# Patient Record
Sex: Male | Born: 1990 | Race: White | Hispanic: No | Marital: Married | Smoking: Never smoker
Health system: Southern US, Community
[De-identification: ages and names within clinical notes are randomized; demographics above are authoritative.]

## PROBLEM LIST (undated history)

## (undated) DIAGNOSIS — G4730 Sleep apnea, unspecified: Secondary | ICD-10-CM

## (undated) DIAGNOSIS — Z91018 Allergy to other foods: Secondary | ICD-10-CM

## (undated) DIAGNOSIS — J40 Bronchitis, not specified as acute or chronic: Secondary | ICD-10-CM

## (undated) DIAGNOSIS — J45909 Unspecified asthma, uncomplicated: Secondary | ICD-10-CM

## (undated) HISTORY — DX: Sleep apnea, unspecified: G47.30

## (undated) HISTORY — DX: Allergy to other foods: Z91.018

## (undated) HISTORY — PX: VASECTOMY: SHX75

## (undated) HISTORY — PX: LEG SURGERY: SHX1003

## (undated) HISTORY — PX: ANKLE FRACTURE SURGERY: SHX122

---

## 2013-11-08 DIAGNOSIS — R053 Chronic cough: Secondary | ICD-10-CM | POA: Insufficient documentation

## 2013-11-08 DIAGNOSIS — J302 Other seasonal allergic rhinitis: Secondary | ICD-10-CM

## 2013-11-08 DIAGNOSIS — F410 Panic disorder [episodic paroxysmal anxiety] without agoraphobia: Secondary | ICD-10-CM | POA: Insufficient documentation

## 2013-11-08 HISTORY — DX: Panic disorder (episodic paroxysmal anxiety): F41.0

## 2013-11-08 HISTORY — DX: Other seasonal allergic rhinitis: J30.2

## 2013-11-08 HISTORY — DX: Chronic cough: R05.3

## 2014-04-16 ENCOUNTER — Encounter (HOSPITAL_COMMUNITY): Payer: Self-pay

## 2014-04-16 ENCOUNTER — Emergency Department (HOSPITAL_COMMUNITY): Payer: 59

## 2014-04-16 ENCOUNTER — Emergency Department (HOSPITAL_COMMUNITY)
Admission: EM | Admit: 2014-04-16 | Discharge: 2014-04-16 | Disposition: A | Discharge status: Home / Self Care | Payer: 59 | Attending: Emergency Medicine | Admitting: Emergency Medicine

## 2014-04-16 DIAGNOSIS — R059 Cough, unspecified: Secondary | ICD-10-CM

## 2014-04-16 DIAGNOSIS — R05 Cough: Secondary | ICD-10-CM

## 2014-04-16 DIAGNOSIS — J45909 Unspecified asthma, uncomplicated: Secondary | ICD-10-CM | POA: Diagnosis not present

## 2014-04-16 DIAGNOSIS — J069 Acute upper respiratory infection, unspecified: Secondary | ICD-10-CM | POA: Diagnosis not present

## 2014-04-16 HISTORY — DX: Unspecified asthma, uncomplicated: J45.909

## 2014-04-16 HISTORY — DX: Bronchitis, not specified as acute or chronic: J40

## 2014-04-16 MED ORDER — ALBUTEROL SULFATE HFA 108 (90 BASE) MCG/ACT IN AERS
2.0000 | INHALATION_SPRAY | RESPIRATORY_TRACT | Status: DC | PRN
  Administered 2014-04-16: 2 via RESPIRATORY_TRACT
  Filled 2014-04-16: qty 6.7

## 2014-04-16 MED ORDER — IPRATROPIUM-ALBUTEROL 0.5-2.5 (3) MG/3ML IN SOLN
3.0000 mL | Freq: Once | RESPIRATORY_TRACT | Status: AC
  Administered 2014-04-16: 3 mL via RESPIRATORY_TRACT
  Filled 2014-04-16: qty 3

## 2014-04-16 MED ORDER — GUAIFENESIN-CODEINE 100-10 MG/5ML PO SOLN: 5.0000 mL | Freq: Three times a day (TID) | ORAL | Status: DC | PRN

## 2014-04-16 MED ORDER — BENZONATATE 100 MG PO CAPS: 100.0000 mg | ORAL_CAPSULE | Freq: Three times a day (TID) | ORAL | Status: DC

## 2014-04-16 NOTE — ED Provider Notes (Signed)
CSN: [id number]     Arrival date & time 04/16/14  0807 History   First MD Initiated Contact with Patient 04/16/14 0818     Chief Complaint  Patient presents with  . Cough     (Consider location/radiation/quality/duration/timing/severity/associated sxs/prior Treatment) HPI  Pt is a 24y/o who presents to the ED today c/o cough x2d. Pt sts he woke up Thursday morning with cough, and had a mild sore throat the day before that has resolved. The cough occasionally produces a thin green sputum, but is not productive most of the time. He also c/o central CP that was initially intermittent and associated with cough. The pain is worsened by deep inspiration, cough, and palpation. It is non-radiating and he rates it 7/10. He does endorse some post-tussive SOB and DOE. He has tried no home therapies or anti-tussives. He denies fever/chills, ear pain, trouble swallowing, stiff neck, hemoptysis, NVD, pain in legs, or leg swelling.   Past Medical History  Diagnosis Date  . Bronchitis   . Asthma    Past Surgical History  Procedure Laterality Date  . Leg surgery    . Ankle fracture surgery     History reviewed. No pertinent family history. History  Substance Use Topics  . Smoking status: Never Smoker   . Smokeless tobacco: Not on file  . Alcohol Use: Yes    Review of Systems  10 Systems reviewed and are negative for acute change except as noted in the HPI.     Allergies  Review of patient's allergies indicates not on file.  Home Medications   Prior to Admission medications   Not on File   BP 142/91 mmHg  Pulse 73  Temp(Src) 98.8 F (37.1 C) (Oral)  Resp 16  SpO2 99% Physical Exam  Constitutional: He appears well-developed and well-nourished. No distress.  HENT:  Head: Normocephalic and atraumatic.  Right Ear: Tympanic membrane and ear canal normal.  Left Ear: Tympanic membrane and ear canal normal.  Nose: Rhinorrhea present. Right sinus exhibits no maxillary sinus tenderness  and no frontal sinus tenderness. Left sinus exhibits no maxillary sinus tenderness and no frontal sinus tenderness.  Mouth/Throat: Uvula is midline, oropharynx is clear and moist and mucous membranes are normal.  Eyes: Pupils are equal, round, and reactive to light.  Neck: Normal range of motion. Neck supple.  Cardiovascular: Normal rate and regular rhythm.   Pulmonary/Chest: Effort normal.  Mildly decreased airway movement, no wheezing, rales or rhonchi. Pt coughing during exam.  Abdominal: Soft. Bowel sounds are normal. There is no tenderness. There is no rebound and no guarding.  Neurological: He is alert.  Skin: Skin is warm and dry.  Nursing note and vitals reviewed.   ED Course  Procedures (including critical care time) Labs Review Labs Reviewed - No data to display  Imaging Review No results found.   EKG Interpretation None      MDM   Final diagnoses:  Cough    Pt is well appearing with URI symptoms, mild sore throat that resolved and transitioned into cough for 2 days. He has not had headache, fevers, rash, SOB at rest, fatigue, chest pains or concerning symptoms.   Medications  albuterol (PROVENTIL HFA;VENTOLIN HFA) 108 (90 BASE) MCG/ACT inhaler 2 puff (2 puffs Inhalation Given 04/16/14 0941)  ipratropium-albuterol (DUONEB) 0.5-2.5 (3) MG/3ML nebulizer solution 3 mL (3 mLs Nebulization Given 04/16/14 0902)    Pt reports mild improvement with nebulizer, still coughing. Requests work note. He has been advised to return if  he develops fevers, neck pain, weakness, or worsening of any of his symptoms. Neg chest xray in ED today.  benzonatate (TESSALON) 100 MG capsule Take 1 capsule (100 mg total) by mouth every 8 (eight) hours. 21 capsule Dorthula Matas, PA-C    guaiFENesin-codeine 100-10 MG/5ML syrup Take 5 mLs by mouth 3 (three) times daily as needed for cough. 120 mL Dorthula Matas, PA-C     24 y.o.[name]'s evaluation in the Emergency Department is  complete. It has been determined that no acute conditions requiring further emergency intervention are present at this time. The patient/guardian have been advised of the diagnosis and plan. We have discussed signs and symptoms that warrant return to the ED, such as changes or worsening in symptoms.  Vital signs are stable at discharge. Filed Vitals:   04/16/14 0816  BP: 142/91  Pulse: 73  Temp: 98.8 F (37.1 C)  Resp: 16    Patient/guardian has voiced understanding and agreed to follow-up with the PCP or specialist.     Dorthula Matas, PA-C 04/16/14 0945  Derwood Kaplan, MD 04/16/14 1204

## 2014-04-16 NOTE — ED Notes (Signed)
Pt c/o chest congestion and cough x 2 days.  Pain score 7/10.  Pt has not taken anything for symptoms.  Dry cough noted.

## 2014-04-16 NOTE — Discharge Instructions (Signed)
Cough, Adult ° A cough is a reflex that helps clear your throat and airways. It can help heal the body or may be a reaction to an irritated airway. A cough may only last 2 or 3 weeks (acute) or may last more than 8 weeks (chronic).  °CAUSES °Acute cough: °· Viral or bacterial infections. °Chronic cough: °· Infections. °· Allergies. °· Asthma. °· Post-nasal drip. °· Smoking. °· Heartburn or acid reflux. °· Some medicines. °· Chronic lung problems (COPD). °· Cancer. °SYMPTOMS  °· Cough. °· Fever. °· Chest pain. °· Increased breathing rate. °· High-pitched whistling sound when breathing (wheezing). °· Colored mucus that you cough up (sputum). °TREATMENT  °· A bacterial cough may be treated with antibiotic medicine. °· A viral cough must run its course and will not respond to antibiotics. °· Your caregiver may recommend other treatments if you have a chronic cough. °HOME CARE INSTRUCTIONS  °· Only take over-the-counter or prescription medicines for pain, discomfort, or fever as directed by your caregiver. Use cough suppressants only as directed by your caregiver. °· Use a cold steam vaporizer or humidifier in your bedroom or home to help loosen secretions. °· Sleep in a semi-upright position if your cough is worse at night. °· Rest as needed. °· Stop smoking if you smoke. °SEEK IMMEDIATE MEDICAL CARE IF:  °· You have pus in your sputum. °· Your cough starts to worsen. °· You cannot control your cough with suppressants and are losing sleep. °· You begin coughing up blood. °· You have difficulty breathing. °· You develop pain which is getting worse or is uncontrolled with medicine. °· You have a fever. °MAKE SURE YOU:  °· Understand these instructions. °· Will watch your condition. °· Will get help right away if you are not doing well or get worse. °Document Released: 08/24/2010 Document Revised: 05/20/2011 Document Reviewed: 08/24/2010 °ExitCare® Patient Information ©2015 [employer]. This information is not intended  to replace advice given to you by your health care provider. Make sure you discuss any questions you have with your health care provider. °Upper Respiratory Infection, Adult °An upper respiratory infection (URI) is also sometimes known as the common cold. The upper respiratory tract includes the nose, sinuses, throat, trachea, and bronchi. Bronchi are the airways leading to the lungs. Most people improve within 1 week, but symptoms can last up to 2 weeks. A residual cough may last even longer.  °CAUSES °Many different viruses can infect the tissues lining the upper respiratory tract. The tissues become irritated and inflamed and often become very moist. Mucus production is also common. A cold is contagious. You can easily spread the virus to others by oral contact. This includes kissing, sharing a glass, coughing, or sneezing. Touching your mouth or nose and then touching a surface, which is then touched by another person, can also spread the virus. °SYMPTOMS  °Symptoms typically develop 1 to 3 days after you come in contact with a cold virus. Symptoms vary from person to person. They may include: °· Runny nose. °· Sneezing. °· Nasal congestion. °· Sinus irritation. °· Sore throat. °· Loss of voice (laryngitis). °· Cough. °· Fatigue. °· Muscle aches. °· Loss of appetite. °· Headache. °· Low-grade fever. °DIAGNOSIS  °You might diagnose your own cold based on familiar symptoms, since most people get a cold 2 to 3 times a year. Your caregiver can confirm this based on your exam. Most importantly, your caregiver can check that your symptoms are not due to another disease such   as strep throat, sinusitis, pneumonia, asthma, or epiglottitis. Blood tests, throat tests, and X-rays are not necessary to diagnose a common cold, but they may sometimes be helpful in excluding other more serious diseases. Your caregiver will decide if any further tests are required. °RISKS AND COMPLICATIONS  °You may be at risk for a more severe  case of the common cold if you smoke cigarettes, have chronic heart disease (such as heart failure) or lung disease (such as asthma), or if you have a weakened immune system. The very young and very old are also at risk for more serious infections. Bacterial sinusitis, middle ear infections, and bacterial pneumonia can complicate the common cold. The common cold can worsen asthma and chronic obstructive pulmonary disease (COPD). Sometimes, these complications can require emergency medical care and may be life-threatening. °PREVENTION  °The best way to protect against getting a cold is to practice good hygiene. Avoid oral or hand contact with people with cold symptoms. Wash your hands often if contact occurs. There is no clear evidence that vitamin C, vitamin E, echinacea, or exercise reduces the chance of developing a cold. However, it is always recommended to get plenty of rest and practice good nutrition. °TREATMENT  °Treatment is directed at relieving symptoms. There is no cure. Antibiotics are not effective, because the infection is caused by a virus, not by bacteria. Treatment may include: °· Increased fluid intake. Sports drinks offer valuable electrolytes, sugars, and fluids. °· Breathing heated mist or steam (vaporizer or shower). °· Eating chicken soup or other clear broths, and maintaining good nutrition. °· Getting plenty of rest. °· Using gargles or lozenges for comfort. °· Controlling fevers with ibuprofen or acetaminophen as directed by your caregiver. °· Increasing usage of your inhaler if you have asthma. °Zinc gel and zinc lozenges, taken in the first 24 hours of the common cold, can shorten the duration and lessen the severity of symptoms. Pain medicines may help with fever, muscle aches, and throat pain. A variety of non-prescription medicines are available to treat congestion and runny nose. Your caregiver can make recommendations and may suggest nasal or lung inhalers for other symptoms.  °HOME  CARE INSTRUCTIONS  °· Only take over-the-counter or prescription medicines for pain, discomfort, or fever as directed by your caregiver. °· Use a warm mist humidifier or inhale steam from a shower to increase air moisture. This may keep secretions moist and make it easier to breathe. °· Drink enough water and fluids to keep your urine clear or pale yellow. °· Rest as needed. °· Return to work when your temperature has returned to normal or as your caregiver advises. You may need to stay home longer to avoid infecting others. You can also use a face mask and careful hand washing to prevent spread of the virus. °SEEK MEDICAL CARE IF:  °· After the first few days, you feel you are getting worse rather than better. °· You need your caregiver's advice about medicines to control symptoms. °· You develop chills, worsening shortness of breath, or brown or red sputum. These may be signs of pneumonia. °· You develop yellow or brown nasal discharge or pain in the face, especially when you bend forward. These may be signs of sinusitis. °· You develop a fever, swollen neck glands, pain with swallowing, or white areas in the back of your throat. These may be signs of strep throat. °SEEK IMMEDIATE MEDICAL CARE IF:  °· You have a fever. °· You develop severe or persistent headache, ear   pain, sinus pain, or chest pain. °· You develop wheezing, a prolonged cough, cough up blood, or have a change in your usual mucus (if you have chronic lung disease). °· You develop sore muscles or a stiff neck. °Document Released: 08/21/2000 Document Revised: 05/20/2011 Document Reviewed: 06/02/2013 °ExitCare® Patient Information ©2015 [employer]. This information is not intended to replace advice given to you by your health care provider. Make sure you discuss any questions you have with your health care provider. ° °

## 2014-06-10 ENCOUNTER — Emergency Department (HOSPITAL_COMMUNITY)
Admission: EM | Admit: 2014-06-10 | Discharge: 2014-06-10 | Disposition: A | Discharge status: Home / Self Care | Payer: 59 | Attending: Emergency Medicine | Admitting: Emergency Medicine

## 2014-06-10 ENCOUNTER — Encounter (HOSPITAL_COMMUNITY): Payer: Self-pay | Admitting: Emergency Medicine

## 2014-06-10 ENCOUNTER — Emergency Department (HOSPITAL_COMMUNITY): Payer: 59

## 2014-06-10 DIAGNOSIS — Y9239 Other specified sports and athletic area as the place of occurrence of the external cause: Secondary | ICD-10-CM | POA: Insufficient documentation

## 2014-06-10 DIAGNOSIS — J45909 Unspecified asthma, uncomplicated: Secondary | ICD-10-CM | POA: Insufficient documentation

## 2014-06-10 DIAGNOSIS — Y998 Other external cause status: Secondary | ICD-10-CM | POA: Diagnosis not present

## 2014-06-10 DIAGNOSIS — W1839XA Other fall on same level, initial encounter: Secondary | ICD-10-CM | POA: Insufficient documentation

## 2014-06-10 DIAGNOSIS — Z79899 Other long term (current) drug therapy: Secondary | ICD-10-CM | POA: Diagnosis not present

## 2014-06-10 DIAGNOSIS — S20212A Contusion of left front wall of thorax, initial encounter: Secondary | ICD-10-CM | POA: Diagnosis not present

## 2014-06-10 DIAGNOSIS — Y9301 Activity, walking, marching and hiking: Secondary | ICD-10-CM | POA: Diagnosis not present

## 2014-06-10 DIAGNOSIS — S299XXA Unspecified injury of thorax, initial encounter: Secondary | ICD-10-CM | POA: Diagnosis present

## 2014-06-10 NOTE — ED Notes (Signed)
Per pt, states he was hiking and fell on his left side-felt something bulge out-went to PCP and eval was negative-woke up this morning with pain under left rib cage

## 2014-06-10 NOTE — ED Provider Notes (Signed)
CSN: [id number]     Arrival date & time 06/10/14  1203 History  This chart was scribed for Elpidio AnisShari Yamaris Cummings, PA-C, working with Raeford RazorStephen Kohut, MD by Chestine SporeSoijett Blue, ED Scribe. The patient was seen in room WTR6/WTR6 at 12:26 PM.    Chief Complaint  Patient presents with  . rib cage pain      The history is provided by the patient. No language interpreter was used.    HPI Comments: Neil White is a 24 y.o. male who presents to the Emergency Department complaining of left sided rib cage pain onset 3 weeks ago. Pt [place] and he fell on his left side 3 weeks ago. Pt notes that when he fell he felt it bulge out and come back in. Pt has been seen by his PCP last week for this issue and was informed that everything was negative. Pt has not had a X-ray done yet. Pt was informed by his PCP that there were no masses and that he should wait a couple weeks. No pleuritic chest pain, SOB or cough. He denies vomiting, abdominal pain, nausea, and any other symptoms.   Past Medical History  Diagnosis Date  . Bronchitis   . Asthma    Past Surgical History  Procedure Laterality Date  . Leg surgery    . Ankle fracture surgery     No family history on file. History  Substance Use Topics  . Smoking status: Never Smoker   . Smokeless tobacco: Not on file  . Alcohol Use: Yes    Review of Systems  Constitutional: Negative for fever.  Respiratory: Negative for cough and shortness of breath.   Cardiovascular: Positive for chest pain.  Gastrointestinal: Negative for nausea, vomiting and abdominal pain.  Musculoskeletal: Positive for arthralgias.      Allergies  Other  Home Medications   Prior to Admission medications   Medication Sig Start Date End Date Taking? Authorizing Provider  Multiple Vitamin (MULTIVITAMIN WITH MINERALS) TABS tablet Take 1 tablet by mouth daily.   Yes Historical Provider, MD  NON FORMULARY Take 1 tablet by mouth 3 (three) times daily. Metabolism  Booster.   Yes Historical Provider, MD  NON FORMULARY Take 1 capsule by mouth 3 (three) times daily. Fat Burner.   Yes Historical Provider, MD  NON FORMULARY Take 1 capsule by mouth 3 (three) times daily. Nutrient Absorber.   Yes Historical Provider, MD  benzonatate (TESSALON) 100 MG capsule Take 1 capsule (100 mg total) by mouth every 8 (eight) hours. Patient not taking: Reported on 06/10/2014 04/16/14   Marlon Peliffany Greene, PA-C  guaiFENesin-codeine 100-10 MG/5ML syrup Take 5 mLs by mouth 3 (three) times daily as needed for cough. Patient not taking: Reported on 06/10/2014 04/16/14   Marlon Peliffany Greene, PA-C   BP 156/88 mmHg  Pulse 103  Temp(Src) 98.2 F (36.8 C) (Oral)  Resp 16  SpO2 100%  Physical Exam  Constitutional: He is oriented to person, place, and time. He appears well-developed and well-nourished. No distress.  HENT:  Head: Normocephalic and atraumatic.  Eyes: EOM are normal.  Neck: Neck supple. No tracheal deviation present.  Cardiovascular: Normal rate.   Pulmonary/Chest: Effort normal and breath sounds normal. No respiratory distress. He has no decreased breath sounds.  Chest wall and abdominal wall appear atraumatic. No bruising or swelling. No chest wall or obvious tenderness. Full breathe sounds.   Abdominal: There is no tenderness. There is no CVA tenderness.  No LUQ tenderness. No CVA tenderness.  Abdominal wall appears atraumatic.   Musculoskeletal: Normal range of motion.  Neurological: He is alert and oriented to person, place, and time.  Skin: Skin is warm and dry.  Psychiatric: He has a normal mood and affect. His behavior is normal.  Nursing note and vitals reviewed.   ED Course  Procedures (including critical care time) DIAGNOSTIC STUDIES: Oxygen Saturation is 100% on RA, normal by my interpretation.    COORDINATION OF CARE: 12:29 PM-Discussed treatment plan which includes X-ray of Ribs Unilateral with chest left with pt at bedside and pt agreed to plan.   Labs  Review Labs Reviewed - No data to display  Imaging Review Dg Ribs Unilateral W/chest Left  06/10/2014   CLINICAL DATA:  Pain following fall 3 weeks prior  EXAM: LEFT RIBS AND CHEST - 3+ VIEW  COMPARISON:  Chest radiograph April 16, 2014  FINDINGS: Frontal chest as well as oblique and cone-down lower rib images obtained. Lungs are clear. Heart size and pulmonary vascularity are normal. No adenopathy. There is no pneumothorax or effusion. No demonstrable rib fracture.  IMPRESSION: No demonstrable rib fracture.  Lungs clear.   Electronically Signed   By: Bretta Bang III M.D.   On: 06/10/2014 13:06     EKG Interpretation None      MDM   Final diagnoses:  None    1. Rib contusion  Abdomen completely nontender. No concern for intra-abdominal injury. Lungs clear. Normal chest x-ray without obvious rib fracture. Stable for discharge.   I personally performed the services described in this documentation, which was scribed in my presence. The recorded information has been reviewed and is accurate.     Elpidio Anis, PA-C 06/10/14 1327  Raeford Razor, MD 06/11/14 [phone]

## 2014-06-10 NOTE — Discharge Instructions (Signed)

## 2015-01-04 ENCOUNTER — Ambulatory Visit (INDEPENDENT_AMBULATORY_CARE_PROVIDER_SITE_OTHER): Payer: BLUE CROSS/BLUE SHIELD | Admitting: Family Medicine

## 2015-01-04 VITALS — BP 114/68 | HR 74 | Temp 98.5°F | Resp 16 | Ht 67.0 in | Wt 180.2 lb

## 2015-01-04 DIAGNOSIS — S39011A Strain of muscle, fascia and tendon of abdomen, initial encounter: Secondary | ICD-10-CM

## 2015-01-04 NOTE — Progress Notes (Signed)
  Subjective:     Neil PennaChristopher White is a 24 y.o. male who presents for evaluation of abdominal pain. Onset was 3 days ago. Symptoms have been unchanged. The pain is described as hot, pressure-like and sharp, and is 5/10 in intensity. Pain is located in the R sided axillary line without radiation.  Aggravating factors: activity.  Alleviating factors: none. Associated symptoms: none. The patient denies anorexia, diarrhea, dysuria, fever, hematochezia, hematuria, melena, myalgias, nausea, sweats and vomiting.  The patient's history has been marked as reviewed and updated as appropriate.  Review of Systems Pertinent items are noted in HPI.     Objective:    BP 114/68 mmHg  Pulse 74  Temp(Src) 98.5 F (36.9 C) (Oral)  Resp 16  Ht 5\' 7"  (1.702 m)  Wt 180 lb 3.2 oz (81.738 kg)  BMI 28.22 kg/m2  SpO2 98%  General Appearance:    Alert, cooperative, no distress, appears stated age  Head:    Normocephalic, without obvious abnormality, atraumatic  Eyes:    PERRL, conjunctiva/corneas clear, EOM's intact, fundi    benign, both eyes       Ears:    Normal TM's and external ear canals, both ears  Nose:   Nares normal, septum midline, mucosa normal, no drainage    or sinus tenderness  Throat:   Lips, mucosa, and tongue normal; teeth and gums normal  Neck:   Supple, symmetrical, trachea midline, no adenopathy;       thyroid:  No enlargement/tenderness/nodules; no carotid   bruit or JVD  Back:     Symmetric, no curvature, ROM normal, no CVA tenderness  Lungs:     Clear to auscultation bilaterally, respirations unlabored  Chest wall:    No tenderness or deformity  Heart:    Regular rate and rhythm, S1 and S2 normal, no murmur, rub   or gallop  Abdomen:     Soft, non-tender, bowel sounds active all four quadrants,    no masses, no organomegaly.  TTP with SB to R at the external oblique  Genitalia:    Normal male without lesion, discharge or tenderness  Rectal:    Normal tone, normal prostate, no  masses or tenderness;   guaiac negative stool  Extremities:   Extremities normal, atraumatic, no cyanosis or edema  Pulses:   2+ and symmetric all extremities  Skin:   Skin color, texture, turgor normal, no rashes or lesions  Lymph nodes:   Cervical, supraclavicular, and axillary nodes normal  Neurologic:   CNII-XII intact. 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    Plan:    MSK muscle pain.  Recommend ice/heat, compression, ibuprofen.  F/U PRN.  No red flags on exam.

## 2015-01-04 NOTE — Patient Instructions (Signed)

## 2015-04-03 ENCOUNTER — Emergency Department (HOSPITAL_COMMUNITY)
Admission: EM | Admit: 2015-04-03 | Discharge: 2015-04-03 | Disposition: A | Discharge status: Home / Self Care | Payer: Managed Care, Other (non HMO) | Attending: Emergency Medicine | Admitting: Emergency Medicine

## 2015-04-03 ENCOUNTER — Encounter (HOSPITAL_COMMUNITY): Payer: Self-pay | Admitting: Emergency Medicine

## 2015-04-03 DIAGNOSIS — M545 Low back pain, unspecified: Secondary | ICD-10-CM

## 2015-04-03 DIAGNOSIS — J45909 Unspecified asthma, uncomplicated: Secondary | ICD-10-CM | POA: Diagnosis not present

## 2015-04-03 LAB — URINALYSIS, ROUTINE W REFLEX MICROSCOPIC
Bilirubin Urine: NEGATIVE
Glucose, UA: NEGATIVE mg/dL
Hgb urine dipstick: NEGATIVE
Ketones, ur: NEGATIVE mg/dL
Leukocytes, UA: NEGATIVE
NITRITE: NEGATIVE
PH: 7.5 (ref 5.0–8.0)
Protein, ur: NEGATIVE mg/dL
SPECIFIC GRAVITY, URINE: 1.015 (ref 1.005–1.030)

## 2015-04-03 LAB — BASIC METABOLIC PANEL
ANION GAP: 11 (ref 5–15)
BUN: 18 mg/dL (ref 6–20)
CHLORIDE: 101 mmol/L (ref 101–111)
CO2: 30 mmol/L (ref 22–32)
Calcium: 10.1 mg/dL (ref 8.9–10.3)
Creatinine, Ser: 1.11 mg/dL (ref 0.61–1.24)
GFR calc Af Amer: >60 mL/min (ref >60)
GFR calc non Af Amer: >60 mL/min (ref >60)
Glucose, Bld: 118 mg/dL — ABNORMAL HIGH (ref 65–99)
POTASSIUM: 3.7 mmol/L (ref 3.5–5.1)
SODIUM: 142 mmol/L (ref 135–145)

## 2015-04-03 LAB — CBC
HCT: 48.1 % (ref 39.0–52.0)
HEMOGLOBIN: 16.6 g/dL (ref 13.0–17.0)
MCH: 30.6 pg (ref 26.0–34.0)
MCHC: 34.5 g/dL (ref 30.0–36.0)
MCV: 88.6 fL (ref 78.0–100.0)
Platelets: 228 10*3/uL (ref 150–400)
RBC: 5.43 MIL/uL (ref 4.22–5.81)
RDW: 12.7 % (ref 11.5–15.5)
WBC: 6 10*3/uL (ref 4.0–10.5)

## 2015-04-03 LAB — URINE MICROSCOPIC-ADD ON
RBC / HPF: NONE SEEN RBC/hpf (ref 0–5)
SQUAMOUS EPITHELIAL / LPF: NONE SEEN
WBC UA: NONE SEEN WBC/hpf (ref 0–5)

## 2015-04-03 MED ORDER — ACETAMINOPHEN 325 MG PO TABS
650.0000 mg | ORAL_TABLET | Freq: Once | ORAL | Status: AC
  Administered 2015-04-03: 650 mg via ORAL
  Filled 2015-04-03: qty 2

## 2015-04-03 NOTE — Discharge Instructions (Signed)
Use OTC pain relievers such as tylenol or motrin for your pain. Heating pads and back stretches are other conservative pain management options.   Back Pain, Adult Back pain is very common in adults.The cause of back pain is rarely dangerous and the pain often gets better over time.The cause of your back pain may not be known. Some common causes of back pain include: 1. Strain of the muscles or ligaments supporting the spine. 2. Wear and tear (degeneration) of the spinal disks. 3. Arthritis. 4. Direct injury to the back. For many people, back pain may return. Since back pain is rarely dangerous, most people can learn to manage this condition on their own. HOME CARE INSTRUCTIONS Watch your back pain for any changes. The following actions may help to lessen any discomfort you are feeling: 1. Remain active. It is stressful on your back to sit or stand in one place for long periods of time. Do not sit, drive, or stand in one place for more than 30 minutes at a time. Take short walks on even surfaces as soon as you are able.Try to increase the length of time you walk each day. 2. Exercise regularly as directed by your health care provider. Exercise helps your back heal faster. It also helps avoid future injury by keeping your muscles strong and flexible. 3. Do not stay in bed.Resting more than 1-2 days can delay your recovery. 4. Pay attention to your body when you bend and lift. The most comfortable positions are those that put less stress on your recovering back. Always use proper lifting techniques, including: 1. Bending your knees. 2. Keeping the load close to your body. 3. Avoiding twisting. 5. Find a comfortable position to sleep. Use a firm mattress and lie on your side with your knees slightly bent. If you lie on your back, put a pillow under your knees. 6. Avoid feeling anxious or stressed.Stress increases muscle tension and can worsen back pain.It is important to recognize when you are  anxious or stressed and learn ways to manage it, such as with exercise. 7. Take medicines only as directed by your health care provider. Over-the-counter medicines to reduce pain and inflammation are often the most helpful.Your health care provider may prescribe muscle relaxant drugs.These medicines help dull your pain so you can more quickly return to your normal activities and healthy exercise. 8. Apply ice to the injured area: 1. Put ice in a plastic bag. 2. Place a towel between your skin and the bag. 3. Leave the ice on for 20 minutes, 2-3 times a day for the first 2-3 days. After that, ice and heat may be alternated to reduce pain and spasms. 9. Maintain a healthy weight. Excess weight puts extra stress on your back and makes it difficult to maintain good posture. SEEK MEDICAL CARE IF: 1. You have pain that is not relieved with rest or medicine. 2. You have increasing pain going down into the legs or buttocks. 3. You have pain that does not improve in one week. 4. You have night pain. 5. You lose weight. 6. You have a fever or chills. SEEK IMMEDIATE MEDICAL CARE IF:  1. You develop new bowel or bladder control problems. 2. You have unusual weakness or numbness in your arms or legs. 3. You develop nausea or vomiting. 4. You develop abdominal pain. 5. You feel faint.   This information is not intended to replace advice given to you by your health care provider. Make sure you discuss any  questions you have with your health care provider.   Document Released: 02/25/2005 Document Revised: 03/18/2014 Document Reviewed: 06/29/2013 Elsevier Interactive Patient Education 2016 [employer]  Back Exercises The following exercises strengthen the muscles that help to support the back. They also help to keep the lower back flexible. Doing these exercises can help to prevent back pain or lessen existing pain. If you have back pain or discomfort, try doing these exercises 2-3 times each day or  as told by your health care provider. When the pain goes away, do them once each day, but increase the number of times that you repeat the steps for each exercise (do more repetitions). If you do not have back pain or discomfort, do these exercises once each day or as told by your health care provider. EXERCISES Single Knee to Chest Repeat these steps 3-5 times for each leg: 5. Lie on your back on a firm bed or the floor with your legs extended. 6. Bring one knee to your chest. Your other leg should stay extended and in contact with the floor. 7. Hold your knee in place by grabbing your knee or thigh. 8. Pull on your knee until you feel a gentle stretch in your lower back. 9. Hold the stretch for 10-30 seconds. 10. Slowly release and straighten your leg. Pelvic Tilt Repeat these steps 5-10 times: 10. Lie on your back on a firm bed or the floor with your legs extended. 11. Bend your knees so they are pointing toward the ceiling and your feet are flat on the floor. 12. Tighten your lower abdominal muscles to press your lower back against the floor. This motion will tilt your pelvis so your tailbone points up toward the ceiling instead of pointing to your feet or the floor. 13. With gentle tension and even breathing, hold this position for 5-10 seconds. Cat-Cow Repeat these steps until your lower back becomes more flexible: 7. Get into a hands-and-knees position on a firm surface. Keep your hands under your shoulders, and keep your knees under your hips. You may place padding under your knees for comfort. 8. Let your head hang down, and point your tailbone toward the floor so your lower back becomes rounded like the back of a cat. 9. Hold this position for 5 seconds. 10. Slowly lift your head and point your tailbone up toward the ceiling so your back forms a sagging arch like the back of a cow. 11. Hold this position for 5 seconds. Press-Ups Repeat these steps 5-10 times: 6. Lie on your abdomen  (face-down) on the floor. 7. Place your palms near your head, about shoulder-width apart. 8. While you keep your back as relaxed as possible and keep your hips on the floor, slowly straighten your arms to raise the top half of your body and lift your shoulders. Do not use your back muscles to raise your upper torso. You may adjust the placement of your hands to make yourself more comfortable. 9. Hold this position for 5 seconds while you keep your back relaxed. 10. Slowly return to lying flat on the floor. Bridges Repeat these steps 10 times: 1. Lie on your back on a firm surface. 2. Bend your knees so they are pointing toward the ceiling and your feet are flat on the floor. 3. Tighten your buttocks muscles and lift your buttocks off of the floor until your waist is at almost the same height as your knees. You should feel the muscles working in your buttocks and  the back of your thighs. If you do not feel these muscles, slide your feet 1-2 inches farther away from your buttocks. 4. Hold this position for 3-5 seconds. 5. Slowly lower your hips to the starting position, and allow your buttocks muscles to relax completely. If this exercise is too easy, try doing it with your arms crossed over your chest. Abdominal Crunches Repeat these steps 5-10 times: 1. Lie on your back on a firm bed or the floor with your legs extended. 2. Bend your knees so they are pointing toward the ceiling and your feet are flat on the floor. 3. Cross your arms over your chest. 4. Tip your chin slightly toward your chest without bending your neck. 5. Tighten your abdominal muscles and slowly raise your trunk (torso) high enough to lift your shoulder blades a tiny bit off of the floor. Avoid raising your torso higher than that, because it can put too much stress on your low back and it does not help to strengthen your abdominal muscles. 6. Slowly return to your starting position. Back Lifts Repeat these steps 5-10  times: 1. Lie on your abdomen (face-down) with your arms at your sides, and rest your forehead on the floor. 2. Tighten the muscles in your legs and your buttocks. 3. Slowly lift your chest off of the floor while you keep your hips pressed to the floor. Keep the back of your head in line with the curve in your back. Your eyes should be looking at the floor. 4. Hold this position for 3-5 seconds. 5. Slowly return to your starting position. SEEK MEDICAL CARE IF:  Your back pain or discomfort gets much worse when you do an exercise.  Your back pain or discomfort does not lessen within 2 hours after you exercise. If you have any of these problems, stop doing these exercises right away. Do not do them again unless your health care provider says that you can. SEEK IMMEDIATE MEDICAL CARE IF:  You develop sudden, severe back pain. If this happens, stop doing the exercises right away. Do not do them again unless your health care provider says that you can.   This information is not intended to replace advice given to you by your health care provider. Make sure you discuss any questions you have with your health care provider.   Document Released: 04/04/2004 Document Revised: 11/16/2014 Document Reviewed: 04/21/2014 Elsevier Interactive Patient Education [employer]

## 2015-04-03 NOTE — ED Notes (Signed)
Pt c/o bilateral flank pain since Friday morning. Pt sts that he engaged in "very heavy" drinking on Thursday night and woke up hurting. Pt denies any other symptoms. No dysuria, hematuria, N/V. Denies injury. A&Ox4 and ambulatory.

## 2015-04-03 NOTE — ED Notes (Signed)
Patient was alert, oriented and stable upon discharge. RN went over AVS and patient had no further questions.  

## 2015-04-03 NOTE — ED Provider Notes (Signed)
CSN: [id number]     Arrival date & time 04/03/15  1103 History   First MD Initiated Contact with Patient 04/03/15 1330     Chief Complaint  Patient presents with  . Flank Pain   HPI  Mr. Bielefeld is a 25 year old male with PMHx of asthma presenting with lower back pain. He reports onset of symptoms 4 days ago. He reports "heavy drinking" the evening before symptom onset. He states that he woke up with bilateral lower back pain the next morning. He describes it as a "mild discomfort". The pain does not radiate. Originally the pain was only present when he was lying on his back or someone was touching his back. He states that this morning he noticed the pain when he was walking down the hallway. He states this is what prompted him to come in emergency department although his pain is currently beginning to improve. He states "this is what I imagine kidney pain feels like ". He has not taken any over-the-counter pain relievers. He denies associated fevers, chills, abdominal pain, nausea, vomiting, lower extremity weakness, lower extremity numbness, loss of bowel or bladder control, painful bowel movements, dysuria, change in the frequency of urination, penile discharge or testicular pain. Denies exposure to STDs. He has no other complaints today.  Past Medical History  Diagnosis Date  . Bronchitis   . Asthma    Past Surgical History  Procedure Laterality Date  . Leg surgery    . Ankle fracture surgery     Family History  Problem Relation Age of Onset  . Hyperlipidemia Father   . Heart disease Father   . Diabetes Maternal Grandmother   . Cancer Maternal Grandfather   . Diabetes Paternal Grandfather   . Heart disease Paternal Grandfather   . Hyperlipidemia Paternal Grandfather    Social History  Substance Use Topics  . Smoking status: Never Smoker   . Smokeless tobacco: None  . Alcohol Use: Yes    Review of Systems  Constitutional: Negative for fever and chills.  Gastrointestinal:  Negative for nausea, vomiting, abdominal pain, diarrhea, constipation and blood in stool.  Genitourinary: Negative for dysuria, frequency, hematuria, discharge, difficulty urinating and testicular pain.  Musculoskeletal: Positive for back pain. Negative for gait problem.  Skin: Negative for rash.  Neurological: Negative for weakness and numbness.  All other systems reviewed and are negative.     Allergies  Other  Home Medications   Prior to Admission medications   Medication Sig Start Date End Date Taking? Authorizing Provider  calcium carbonate (TUMS - DOSED IN MG ELEMENTAL CALCIUM) 500 MG chewable tablet Chew 1-2 tablets by mouth 3 (three) times daily as needed for indigestion or heartburn.   Yes Historical Provider, MD  guaiFENesin (ROBITUSSIN) 100 MG/5ML liquid Take 400 mg by mouth 3 (three) times daily as needed for cough.   Yes Historical Provider, MD  guaiFENesin-codeine 100-10 MG/5ML syrup Take 5 mLs by mouth 3 (three) times daily as needed for cough. Patient not taking: Reported on 06/10/2014 04/16/14   Marlon Pel, PA-C   BP 135/78 mmHg  Pulse 63  Temp(Src) 98.5 F (36.9 C) (Oral)  Resp 16  SpO2 99% Physical Exam  Constitutional: He appears well-developed and well-nourished. No distress.  HENT:  Head: Normocephalic and atraumatic.  Eyes: Conjunctivae are normal. Right eye exhibits no discharge. Left eye exhibits no discharge. No scleral icterus.  Neck: Normal range of motion.  Cardiovascular: Normal rate and regular rhythm.   Pulmonary/Chest: Effort normal.  No respiratory distress.  Abdominal: Soft. There is no tenderness.  Genitourinary: Rectum normal and prostate normal.  Prostate is not enlarged, tender, boggy or warm. Rectal exam chaperoned by Molly Maduro, EMT  Musculoskeletal: Normal range of motion.       Lumbar back: He exhibits normal range of motion, no tenderness, no deformity and no spasm.       Back:  Pt indicates site of pain as being his bilateral lumbar  region. Lumbar region is not tender to palpation. No focal tenderness over lumbar spine. FROM of lumbar and thoracic spine without pain. Pt walks with a steady gait and moves all extremities spontaneously.   Neurological: He is alert. Coordination normal.  5/5 strength of bilateral lower extremities. Sensation to light touch intact.   Skin: Skin is warm and dry.  No erythema, rash or other skin changes noted over lumbar region  Psychiatric: He has a normal mood and affect. His behavior is normal.  Nursing note and vitals reviewed.   ED Course  Procedures (including critical care time) Labs Review Labs Reviewed  URINALYSIS, ROUTINE W REFLEX MICROSCOPIC (NOT AT [facility]) - Abnormal; Notable for the following:    APPearance TURBID (*)    All other components within normal limits  BASIC METABOLIC PANEL - Abnormal; Notable for the following:    Glucose, Bld 118 (*)    All other components within normal limits  URINE MICROSCOPIC-ADD ON - Abnormal; Notable for the following:    Bacteria, UA MANY (*)    All other components within normal limits  URINE CULTURE  CBC    Imaging Review No results found. I have personally reviewed and evaluated these images and lab results as part of my medical decision-making.   EKG Interpretation None      MDM   Final diagnoses:  Bilateral low back pain without sciatica   Patient presenting with lower back pain x 4 days. Afebrile. Non-focal neuro exam. No tenderness over lumbar region with FROM of back intact without pain. Patient is able to ambulate without pain. No signs of prostatitis. No loss of bowel or bladder control. No numbness or weakness in the lower extremities. No concern for cauda equina. No history of IVDU or cancer. UA positive for many bacteria without leuks or nitrites. No GU symptoms. Clinical picture not consistent with infection. Will send for culture and give pt urology referral as requested. Conservative therapy including back  exercises, heat, ice, tylenol or ibuprofen discussed.  Return precautions discussed and given in discharge paperwork. Pt is stable for discharge.      Rolm Gala Iori Gigante, PA-C 04/03/15 1522  Arby Barrette, MD 04/03/15 [phone]

## 2015-04-04 LAB — URINE CULTURE: Culture: NO GROWTH

## 2015-08-13 ENCOUNTER — Encounter (HOSPITAL_COMMUNITY): Payer: Self-pay

## 2015-08-13 ENCOUNTER — Emergency Department (HOSPITAL_COMMUNITY): Payer: 59

## 2015-08-13 ENCOUNTER — Emergency Department (HOSPITAL_COMMUNITY)
Admission: EM | Admit: 2015-08-13 | Discharge: 2015-08-13 | Disposition: A | Discharge status: Home / Self Care | Payer: 59 | Attending: Emergency Medicine | Admitting: Emergency Medicine

## 2015-08-13 DIAGNOSIS — R0789 Other chest pain: Secondary | ICD-10-CM

## 2015-08-13 DIAGNOSIS — J45909 Unspecified asthma, uncomplicated: Secondary | ICD-10-CM | POA: Insufficient documentation

## 2015-08-13 MED ORDER — NAPROXEN 500 MG PO TABS: 500.0000 mg | ORAL_TABLET | Freq: Two times a day (BID) | ORAL | Status: DC

## 2015-08-13 MED ORDER — METHOCARBAMOL 750 MG PO TABS: 750.0000 mg | ORAL_TABLET | Freq: Four times a day (QID) | ORAL | Status: DC

## 2015-08-13 NOTE — ED Notes (Signed)
Per MD, ok to hold on labs at this time.

## 2015-08-13 NOTE — Discharge Instructions (Signed)

## 2015-08-13 NOTE — ED Notes (Addendum)
Pt states chest pain non radiating x 2 weeks.  Cough chronic since childhood.  Pain increases with movement.  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  Pt also states he takes tums for reflux.

## 2015-08-13 NOTE — ED Provider Notes (Signed)
CSN: [id number]     Arrival date & time 08/13/15  1210 History   First MD Initiated Contact with Patient 08/13/15 1315     Chief Complaint  Patient presents with  . Chest Pain  . Shortness of Breath     (Consider location/radiation/quality/duration/timing/severity/associated sxs/prior Treatment) Patient is a 25 y.o. male presenting with chest pain and shortness of breath. The history is provided by the patient.  Chest Pain Pain location:  L chest Pain quality: sharp   Pain radiates to:  Does not radiate Pain radiates to the back: no   Pain severity:  Mild Onset quality:  Gradual Duration:  2 weeks Timing:  Constant Progression:  Unchanged Chronicity:  New Context: movement   Relieved by:  Nothing Worsened by:  Nothing tried Ineffective treatments:  None tried Associated symptoms: shortness of breath   Shortness of Breath Associated symptoms: chest pain     Past Medical History  Diagnosis Date  . Bronchitis   . Asthma    Past Surgical History  Procedure Laterality Date  . Leg surgery    . Ankle fracture surgery     Family History  Problem Relation Age of Onset  . Hyperlipidemia Father   . Heart disease Father   . Diabetes Maternal Grandmother   . Cancer Maternal Grandfather   . Diabetes Paternal Grandfather   . Heart disease Paternal Grandfather   . Hyperlipidemia Paternal Grandfather    Social History  Substance Use Topics  . Smoking status: Never Smoker   . Smokeless tobacco: None  . Alcohol Use: Yes    Review of Systems  Respiratory: Positive for shortness of breath.   Cardiovascular: Positive for chest pain.  All other systems reviewed and are negative.     Allergies  Other and Banana  Home Medications   Prior to Admission medications   Medication Sig Start Date End Date Taking? Authorizing Provider  calcium carbonate (TUMS - DOSED IN MG ELEMENTAL CALCIUM) 500 MG chewable tablet Chew 1-2 tablets by mouth 3 (three) times daily as needed for  indigestion or heartburn.   Yes Historical Provider, MD  guaiFENesin-codeine 100-10 MG/5ML syrup Take 5 mLs by mouth 3 (three) times daily as needed for cough. Patient not taking: Reported on 06/10/2014 04/16/14   Marlon Peliffany Greene, PA-C   BP 153/89 mmHg  Pulse 100  Temp(Src) 98.2 F (36.8 C) (Oral)  Resp 20  SpO2 100% Physical Exam  Constitutional: He is oriented to person, place, and time. He appears well-developed and well-nourished.  Non-toxic appearance. No distress.  HENT:  Head: Normocephalic and atraumatic.  Eyes: Conjunctivae, EOM and lids are normal. Pupils are equal, round, and reactive to light.  Neck: Normal range of motion. Neck supple. No tracheal deviation present. No thyroid mass present.  Cardiovascular: Normal rate, regular rhythm and normal heart sounds.  Exam reveals no gallop.   No murmur heard. Pulmonary/Chest: Effort normal and breath sounds normal. No stridor. No respiratory distress. He has no decreased breath sounds. He has no wheezes. He has no rhonchi. He has no rales.  Abdominal: Soft. Normal appearance and bowel sounds are normal. He exhibits no distension. There is no tenderness. There is no rebound and no CVA tenderness.  Musculoskeletal: Normal range of motion. He exhibits no edema or tenderness.  Neurological: He is alert and oriented to person, place, and time. He has normal strength. No cranial nerve deficit or sensory deficit. GCS eye subscore is 4. GCS verbal subscore is 5. GCS motor subscore is  6.  Skin: Skin is warm and dry. No abrasion and no rash noted.  Psychiatric: He has a normal mood and affect. His speech is normal and behavior is normal.  Nursing note and vitals reviewed.   ED Course  Procedures (including critical care time) Labs Review Labs Reviewed - No data to display  Imaging Review Dg Chest 2 View  08/13/2015  CLINICAL DATA:  Chest pain EXAM: CHEST  2 VIEW COMPARISON:  06/10/2014 FINDINGS: The heart size and mediastinal contours are  within normal limits. Both lungs are clear. The visualized skeletal structures are unremarkable. IMPRESSION: No active cardiopulmonary disease. Electronically Signed   By: Signa Kell M.D.   On: 08/13/2015 12:57   I have personally reviewed and evaluated these images and lab results as part of my medical decision-making.   EKG Interpretation   Date/Time:  Sunday August 13 2015 12:20:46 EDT Ventricular Rate:  76 PR Interval:  137 QRS Duration: 102 QT Interval:  365 QTC Calculation: 410 R Axis:   81 Text Interpretation:  Sinus rhythm Anterior infarct, old Nonspecific T  abnormalities, lateral leads Confirmed by Kateryn Marasigan  MD, Austynn Pridmore ([id number]) on  08/13/2015 1:20:51 PM      MDM   Final diagnoses:  None    Pt w/ chest wall pain , likely costochrondritis, will tx--no conern for acs for pr    Lorre Nick, MD 08/13/15 1327

## 2016-03-29 ENCOUNTER — Encounter: Payer: Self-pay | Admitting: Internal Medicine

## 2016-03-29 ENCOUNTER — Other Ambulatory Visit (INDEPENDENT_AMBULATORY_CARE_PROVIDER_SITE_OTHER): Payer: 59

## 2016-03-29 ENCOUNTER — Ambulatory Visit (INDEPENDENT_AMBULATORY_CARE_PROVIDER_SITE_OTHER): Payer: 59 | Admitting: Internal Medicine

## 2016-03-29 VITALS — BP 110/78 | HR 90 | Ht 67.0 in | Wt 193.2 lb

## 2016-03-29 DIAGNOSIS — J4530 Mild persistent asthma, uncomplicated: Secondary | ICD-10-CM

## 2016-03-29 DIAGNOSIS — J302 Other seasonal allergic rhinitis: Secondary | ICD-10-CM

## 2016-03-29 HISTORY — DX: Mild persistent asthma, uncomplicated: J45.30

## 2016-03-29 LAB — CBC WITH DIFFERENTIAL/PLATELET
Basophils Absolute: 0 10*3/uL (ref 0.0–0.1)
Basophils Relative: 0.4 % (ref 0.0–3.0)
EOS ABS: 0.5 10*3/uL (ref 0.0–0.7)
Eosinophils Relative: 5.5 % — ABNORMAL HIGH (ref 0.0–5.0)
HCT: 47.8 % (ref 39.0–52.0)
HEMOGLOBIN: 16.9 g/dL (ref 13.0–17.0)
Lymphocytes Relative: 29.7 % (ref 12.0–46.0)
Lymphs Abs: 2.6 10*3/uL (ref 0.7–4.0)
MCHC: 35.4 g/dL (ref 30.0–36.0)
MCV: 86.9 fl (ref 78.0–100.0)
MONO ABS: 0.7 10*3/uL (ref 0.1–1.0)
Monocytes Relative: 7.6 % (ref 3.0–12.0)
Neutro Abs: 5 10*3/uL (ref 1.4–7.7)
Neutrophils Relative %: 56.8 % (ref 43.0–77.0)
Platelets: 296 10*3/uL (ref 150.0–400.0)
RBC: 5.51 Mil/uL (ref 4.22–5.81)
RDW: 12.9 % (ref 11.5–15.5)
WBC: 8.8 10*3/uL (ref 4.0–10.5)

## 2016-03-29 LAB — NITRIC OXIDE: Other: 33

## 2016-03-29 MED ORDER — MONTELUKAST SODIUM 10 MG PO TABS: 10.0000 mg | ORAL_TABLET | Freq: Every day | ORAL | 11 refills | Status: DC

## 2016-03-29 NOTE — Patient Instructions (Addendum)
Plan A = Automatic = Singulair 10 mg one daily (montelukast is the generic name)   For drainage / throat tickle try take CHLORPHENIRAMINE  4 mg - take one every 4 hours as needed - available over the counter- may cause drowsiness so start with just a bedtime dose or two and see how you tolerate it before trying in daytime    Plan B = Backup Only use your albuterol(proair)  as a rescue medication to be used if you can't catch your breath by resting or doing a relaxed purse lip breathing pattern.  - The less you use it, the better it will work when you need it. - Ok to use the inhaler up to 2 puffs  every 4 hours if you must but call for appointment if use goes up over your usual need - Don't leave home without it !!  (think of it like the spare tire for your car)   Work on inhaler technique:  relax and gently blow all the way out then take a nice smooth deep breath back in, triggering the inhaler at same time you start breathing in.  Hold for up to 5 seconds if you can. Blow out thru nose. Rinse and gargle with water when done      Please remember to go to the lab  department downstairs for your tests - we will call you with the results when they are available.  Please schedule a follow up office visit in 6 weeks, call sooner if needed   A copy of this note will be sent to your primary care provider  Needs spirometry on return

## 2016-03-29 NOTE — Progress Notes (Signed)
Subjective:    Patient ID: Neil White, male    DOB: December 18, 1990,    MRN: [id number]  HPI  [age] yowm never smoker lots of passive exp until the age 26 with onset late teens of lungs burning coughing and wheezing esp in winter and spring time lots of nasal drainage eval by allergist in  no rx rec per pt and just treated himself with clariton until early Dec 2017 placed on proair which helped some and referred to pulmonary clinic 03/29/2016 by Dr   Via   03/29/2016 1st [facility] office visit/ Olivier Frayre   Chief Complaint  Patient presents with  . Pulm Consult    Referred by Dr. Susa SimmondsVia for increased SOB during cold months. Non productive cough for 10 years.    not needing any saba for weeks for sob but bothered by daytime cough x 10 years with sense of pnds worse in spring  Never tried singulair / rx flonase in past but never tried it   No obvious patterns in day to day or daytime variability or assoc excess/ purulent sputum or mucus plugs or hemoptysis or cp or chest tightness, subjective wheeze or overt   hb symptoms. No unusual exp hx or h/o childhood pna/ asthma or knowledge of premature birth.  Sleeping ok without nocturnal  or early am exacerbation  of respiratory  c/o's or need for noct saba. Also denies any obvious fluctuation of symptoms with weather or environmental changes or other aggravating or alleviating factors except as outlined above   Current Medications, Allergies, Complete Past Medical History, Past Surgical History, Family History, and Social History were reviewed in [facility] electronic medical record.   .         Review of Systems  Constitutional: Negative for fever and unexpected weight change.  HENT: Negative for congestion, dental problem, ear pain, nosebleeds, postnasal drip, rhinorrhea, sinus pressure, sneezing, sore throat and trouble swallowing.   Eyes: Negative for redness and itching.  Respiratory: Positive for shortness of breath. Negative  for cough, chest tightness and wheezing.   Cardiovascular: Negative for palpitations and leg swelling.  Gastrointestinal: Negative for nausea and vomiting.  Genitourinary: Negative for dysuria.  Musculoskeletal: Negative for joint swelling.  Skin: Negative for rash.  Neurological: Negative for headaches.  Hematological: Does not bruise/bleed easily.  Psychiatric/Behavioral: Negative for dysphoric mood. The patient is not nervous/anxious.        Objective:   Physical Exam    amb pleasant wm nad  Wt Readings from Last 3 Encounters:  03/29/16 193 lb 3.2 oz (87.6 kg)  01/04/15 180 lb 3.2 oz (81.7 kg)    Vital signs reviewed - - Note on arrival 02 sats  97% on RA     HEENT: nl dentition,  and oropharynx. Nl external ear canals without cough reflex - moderate bilateral non-specific turbinate edema     NECK :  without JVD/Nodes/TM/ nl carotid upstrokes bilaterally   LUNGS: no acc muscle use,  Nl contour chest which is clear to A and P bilaterally without cough on insp or exp maneuvers   CV:  RRR  no s3 or murmur or increase in P2, nad no edema   ABD:  soft and nontender with nl inspiratory excursion in the supine position. No bruits or organomegaly appreciated, bowel sounds nl  MS:  Nl gait/ ext warm without deformities, calf tenderness, cyanosis or clubbing No obvious joint restrictions   SKIN: warm and dry without lesions    NEURO:  alert, approp, nl sensorium with  no motor or cerebellar deficits apparent.      I personally reviewed images and agree with radiology impression as follows:  CXR:   08/13/15 No active cardiopulmonary disease.   Labs ordered 03/29/2016  Allergy profile        Assessment & Plan:

## 2016-03-30 DIAGNOSIS — J309 Allergic rhinitis, unspecified: Secondary | ICD-10-CM | POA: Insufficient documentation

## 2016-03-30 HISTORY — DX: Allergic rhinitis, unspecified: J30.9

## 2016-03-30 NOTE — Assessment & Plan Note (Addendum)
Allergy profile 03/29/2016 >  Eos 0.5/  IgE    Has not tried flonase yet but prefers pill so reasonable to first try singulair and prn h1's if tolerates   see avs for instructions unique to this ov

## 2016-03-30 NOTE — Assessment & Plan Note (Signed)
FENO 03/29/2016  =   34 - 03/29/2016  After extensive coaching HFA effectiveness =    75% from a baseline of 25% - trial of singulair 03/29/2016 >>>  Not really sure he has true chronic asthma at this point and may have more of an intermittent or cough variant pattern but since assoc with chronic rhinitis with seasonal flare reasonable to start rx with singulair and bring back with spirometry on return and consider maint rx then depending on whether there is residual airflow obstruction detected or not  - ICS if not obst/ ics/laba if there is   Total time devoted to counseling  > 50 % of 60 min initial office visit:  review case with pt/ discussion of options/alternatives/ personally creating written customized instructions  in presence of pt  then going over those specific  Instructions directly with the pt including how to use all of the meds but in particular covering each new medication in detail and the difference between the maintenance/automatic meds and the prns using an action plan format for the latter.  Please see AVS from this visit for a full list of these instructions which I personally wrote for this pt and  are unique to this visit.

## 2016-04-01 LAB — RESPIRATORY ALLERGY PROFILE REGION II ~~LOC~~
ALLERGEN, OAK, T7: 0.18 kU/L — AB
Allergen, C. Herbarum, M2: <0.1 kU/L
Allergen, Cedar tree, t12: 0.17 kU/L — ABNORMAL HIGH
Allergen, Comm Silver Birch, t9: 0.19 kU/L — ABNORMAL HIGH
Allergen, Cottonwood, t14: 0.2 kU/L — ABNORMAL HIGH
Allergen, D pternoyssinus,d7: 3.46 kU/L — ABNORMAL HIGH
Allergen, Mouse Urine Protein, e78: 0.3 kU/L — ABNORMAL HIGH
BOX ELDER: 1.27 kU/L — AB
Bermuda Grass: 0.2 kU/L — ABNORMAL HIGH
CAT DANDER: 46.2 kU/L — AB
COCKROACH: 0.14 kU/L — AB
COMMON RAGWEED: 0.21 kU/L — AB
D. farinae: 4.14 kU/L — ABNORMAL HIGH
Dog Dander: 38.3 kU/L — ABNORMAL HIGH
Elm IgE: 0.36 kU/L — ABNORMAL HIGH
IgE (Immunoglobulin E), Serum: 529 kU/L — ABNORMAL HIGH (ref <115)
Johnson Grass: 0.18 kU/L — ABNORMAL HIGH
PECAN/HICKORY TREE IGE: 3.63 kU/L — AB
Rough Pigweed  IgE: 0.37 kU/L — ABNORMAL HIGH
Sheep Sorrel IgE: 0.13 kU/L — ABNORMAL HIGH
TIMOTHY GRASS: 0.39 kU/L — AB

## 2016-04-01 NOTE — Progress Notes (Signed)
Spoke with pt and notified of results per Dr. Wert. Pt verbalized understanding and denied any questions. 

## 2018-10-14 ENCOUNTER — Other Ambulatory Visit: Payer: Self-pay

## 2018-10-14 ENCOUNTER — Encounter (HOSPITAL_COMMUNITY): Payer: Self-pay | Admitting: Obstetrics and Gynecology

## 2018-10-14 ENCOUNTER — Emergency Department (HOSPITAL_COMMUNITY)
Admission: EM | Admit: 2018-10-14 | Discharge: 2018-10-14 | Disposition: A | Discharge status: Home / Self Care | Payer: No Typology Code available for payment source | Attending: Emergency Medicine | Admitting: Emergency Medicine

## 2018-10-14 ENCOUNTER — Emergency Department (HOSPITAL_COMMUNITY): Payer: No Typology Code available for payment source

## 2018-10-14 DIAGNOSIS — K529 Noninfective gastroenteritis and colitis, unspecified: Secondary | ICD-10-CM

## 2018-10-14 DIAGNOSIS — J45909 Unspecified asthma, uncomplicated: Secondary | ICD-10-CM | POA: Insufficient documentation

## 2018-10-14 DIAGNOSIS — R1033 Periumbilical pain: Secondary | ICD-10-CM | POA: Diagnosis present

## 2018-10-14 LAB — COMPREHENSIVE METABOLIC PANEL
ALT: 40 U/L (ref 0–44)
AST: 28 U/L (ref 15–41)
Albumin: 4.6 g/dL (ref 3.5–5.0)
Alkaline Phosphatase: 71 U/L (ref 38–126)
Anion gap: 12 (ref 5–15)
BUN: 15 mg/dL (ref 6–20)
CO2: 24 mmol/L (ref 22–32)
Calcium: 10 mg/dL (ref 8.9–10.3)
Chloride: 101 mmol/L (ref 98–111)
Creatinine, Ser: 1.02 mg/dL (ref 0.61–1.24)
GFR calc Af Amer: >60 mL/min (ref >60)
GFR calc non Af Amer: >60 mL/min (ref >60)
Glucose, Bld: 118 mg/dL — ABNORMAL HIGH (ref 70–99)
Potassium: 3.7 mmol/L (ref 3.5–5.1)
Sodium: 137 mmol/L (ref 135–145)
Total Bilirubin: 0.7 mg/dL (ref 0.3–1.2)
Total Protein: 8 g/dL (ref 6.5–8.1)

## 2018-10-14 LAB — URINALYSIS, ROUTINE W REFLEX MICROSCOPIC
Bacteria, UA: NONE SEEN
Bilirubin Urine: NEGATIVE
Glucose, UA: NEGATIVE mg/dL
Ketones, ur: NEGATIVE mg/dL
Leukocytes,Ua: NEGATIVE
Nitrite: NEGATIVE
Protein, ur: NEGATIVE mg/dL
Specific Gravity, Urine: 1.005 (ref 1.005–1.030)
pH: 6 (ref 5.0–8.0)

## 2018-10-14 LAB — TYPE AND SCREEN
ABO/RH(D): O POS
Antibody Screen: NEGATIVE

## 2018-10-14 LAB — CBC
HCT: 47.4 % (ref 39.0–52.0)
Hemoglobin: 16.5 g/dL (ref 13.0–17.0)
MCH: 30.2 pg (ref 26.0–34.0)
MCHC: 34.8 g/dL (ref 30.0–36.0)
MCV: 86.8 fL (ref 80.0–100.0)
Platelets: 252 10*3/uL (ref 150–400)
RBC: 5.46 MIL/uL (ref 4.22–5.81)
RDW: 11.9 % (ref 11.5–15.5)
WBC: 16.6 10*3/uL — ABNORMAL HIGH (ref 4.0–10.5)
nRBC: 0 % (ref 0.0–0.2)

## 2018-10-14 LAB — ABO/RH: ABO/RH(D): O POS

## 2018-10-14 LAB — LIPASE, BLOOD: Lipase: 24 U/L (ref 11–51)

## 2018-10-14 MED ORDER — IOHEXOL 300 MG/ML  SOLN
100.0000 mL | Freq: Once | INTRAMUSCULAR | Status: AC | PRN
  Administered 2018-10-14: 100 mL via INTRAVENOUS

## 2018-10-14 MED ORDER — SODIUM CHLORIDE (PF) 0.9 % IJ SOLN
INTRAMUSCULAR | Status: AC
  Filled 2018-10-14: qty 50

## 2018-10-14 MED ORDER — ONDANSETRON 4 MG PO TBDP: 4.0000 mg | ORAL_TABLET | Freq: Three times a day (TID) | ORAL | 0 refills | Status: DC | PRN

## 2018-10-14 NOTE — ED Provider Notes (Signed)
[facility]-EMERGENCY DEPT Provider Note   CSN: [id number] Arrival date & time: 10/14/18  [id number]    History   Chief Complaint Chief Complaint  Patient presents with  . Blood In Stools  . Abdominal Pain    HPI Neil White is a 28 y.o. male.     28 year old male presents with complaint of abdominal pain with loose bloody stools.  Patient states he woke up at 1:00 in the morning with periumbilical abdominal cramping, went to the bathroom and had loose stools followed by bloody stools. Patient reports intermittent cramping pains located in the periumbilical area and suprapubic area, improved with Tylenol given in UC today, associated with nausea, no vomiting. Denies fevers, states he had chills but unsure if that was due to room temperature in the house. No changes in bladder habits. No prior abdominal surgeries, no history of GI disorder. States similar episode a few months ago that resolved in a day, did not seek care a that time due to lack of insurance. No known sick contacts, no recent travel.     Past Medical History:  Diagnosis Date  . Asthma   . Bronchitis     Patient Active Problem List   Diagnosis Date Noted  . Chronic allergic rhinitis 03/30/2016  . Mild persistent asthma without complication 03/29/2016    Past Surgical History:  Procedure Laterality Date  . ANKLE FRACTURE SURGERY    . LEG SURGERY          Home Medications    Prior to Admission medications   Medication Sig Start Date End Date Taking? Authorizing Provider  albuterol (PROVENTIL HFA;VENTOLIN HFA) 108 (90 Base) MCG/ACT inhaler Inhale 2 puffs into the lungs every 6 (six) hours as needed for wheezing or shortness of breath.   Yes [provider]  famotidine (PEPCID) 10 MG tablet Take 10 mg by mouth 2 (two) times daily as needed for heartburn or indigestion.   Yes [provider]  montelukast (SINGULAIR) 10 MG tablet Take 1 tablet (10 mg total) by mouth at  bedtime. Patient not taking: Reported on 10/14/2018 03/29/16   Nyoka CowdenWert, Michael B, MD  ondansetron (ZOFRAN ODT) 4 MG disintegrating tablet Take 1 tablet (4 mg total) by mouth every 8 (eight) hours as needed for nausea or vomiting. 10/14/18   Jeannie FendMurphy, Jaray Boliver A, PA-C    Family History Family History  Problem Relation Age of Onset  . Hyperlipidemia Father   . Heart disease Father   . Diabetes Maternal Grandmother   . Cancer Maternal Grandfather   . Diabetes Paternal Grandfather   . Heart disease Paternal Grandfather   . Hyperlipidemia Paternal Grandfather     Social History Social History   Tobacco Use  . Smoking status: Never Smoker  . Smokeless tobacco: Never Used  Substance Use Topics  . Alcohol use: Yes    Comment: 3 mixed drinks a week  . Drug use: No     Allergies   Other and Banana   Review of Systems Review of Systems  Constitutional: Positive for chills. Negative for fever.  Respiratory: Negative for shortness of breath.   Cardiovascular: Negative for chest pain.  Gastrointestinal: Positive for abdominal pain, blood in stool, diarrhea and nausea. Negative for constipation and vomiting.  Genitourinary: Negative for difficulty urinating, dysuria, flank pain, frequency and hematuria.  Musculoskeletal: Negative for arthralgias and myalgias.  Skin: Negative for rash and wound.  Allergic/Immunologic: Negative for immunocompromised state.  Neurological: Negative for weakness.  Hematological: Does not  bruise/bleed easily.  All other systems reviewed and are negative.    Physical Exam Updated Vital Signs BP (!) 161/91   Pulse 72   Temp 98 F (36.7 C) (Oral)   Resp 20   SpO2 98%   Physical Exam Vitals signs and nursing note reviewed.  Constitutional:      General: He is not in acute distress.    Appearance: He is well-developed. He is not diaphoretic.  HENT:     Head: Normocephalic and atraumatic.  Cardiovascular:     Rate and Rhythm: Normal rate and regular  rhythm.     Heart sounds: Normal heart sounds.  Pulmonary:     Effort: Pulmonary effort is normal.     Breath sounds: Normal breath sounds.  Abdominal:     Palpations: Abdomen is soft.     Tenderness: There is no abdominal tenderness.  Skin:    General: Skin is warm and dry.  Neurological:     Mental Status: He is alert and oriented to person, place, and time.  Psychiatric:        Behavior: Behavior normal.      ED Treatments / Results  Labs (all labs ordered are listed, but only abnormal results are displayed) Labs Reviewed  COMPREHENSIVE METABOLIC PANEL - Abnormal; Notable for the following components:      Result Value   Glucose, Bld 118 (*)    All other components within normal limits  CBC - Abnormal; Notable for the following components:   WBC 16.6 (*)    All other components within normal limits  URINALYSIS, ROUTINE W REFLEX MICROSCOPIC - Abnormal; Notable for the following components:   Color, Urine STRAW (*)    Hgb urine dipstick SMALL (*)    All other components within normal limits  LIPASE, BLOOD  POC OCCULT BLOOD, ED  TYPE AND SCREEN  ABO/RH    EKG None  Radiology Ct Abdomen Pelvis W Contrast  Result Date: 10/14/2018 CLINICAL DATA:  Acute generalized abdominal pain beginning last night. EXAM: CT ABDOMEN AND PELVIS WITH CONTRAST TECHNIQUE: Multidetector CT imaging of the abdomen and pelvis was performed using the standard protocol following bolus administration of intravenous contrast. CONTRAST:  115mL OMNIPAQUE IOHEXOL 300 MG/ML  SOLN COMPARISON:  None. FINDINGS: Lower chest: Normal Hepatobiliary: Normal Pancreas: Normal Spleen: Normal Adrenals/Urinary Tract: Adrenal glands are normal. Kidneys are normal. Bladder is normal. Stomach/Bowel: No evidence of bowel obstruction. The appendix is normal. One could question mild wall thickening of the left colon that could go along with colitis. This is not advanced. Findings could be due to inflammatory bowel disease or  infectious colitis Vascular/Lymphatic: Normal Reproductive: Normal Other: No free fluid or air. Musculoskeletal: Normal IMPRESSION: Mild but probably real wall thickening of the left colon suggesting either inflammatory or infectious colitis. Otherwise normal scan. Electronically Signed   By: Nelson Chimes M.D.   On: 10/14/2018 11:32    Procedures Procedures (including critical care time)  Medications Ordered in ED Medications  sodium chloride (PF) 0.9 % injection (has no administration in time range)  iohexol (OMNIPAQUE) 300 MG/ML solution 100 mL (100 mLs Intravenous Contrast Given 10/14/18 1113)     Initial Impression / Assessment and Plan / ED Course  I have reviewed the triage vital signs and the nursing notes.  Pertinent labs & imaging results that were available during my care of the patient were reviewed by me and considered in my medical decision making (see chart for details).  Clinical Course as of  Oct 14 1155  Wed Oct 14, 2018  [id number] 28 year old male sent by urgent care for periumbilical abdominal pain with bloody loose stools today.  On urgent care exam, patient was noted to have right lower quadrant pain with rebound tenderness, Hemoccult positive stool.  Patient was given Tylenol which is largely help with his pain, reports ongoing suprapubic discomfort, his abdomen is soft and nontender at this time.  Patient reports a similar episode of pain and bloody stools about a month or so ago, did not seek care at that time due to lack of insurance.   [LM]  1156 Review of lab work, mild leukocytosis with White count of 16.6, no anemia.  Lipase within normal limits.  CMP without significant changes, urinalysis with small hemoglobin, blood type a positive.  Review of CT scan shows likely colitis, appendix normal.  Discussed results and plan of care with patient.  Recommend continue with Tylenol at home as needed for pain as this helps today.  Given prescription for Zofran to take for nausea if  needed.  Recommend bland diet and follow-up with GI.  Patient return to ER for any new or worsening symptoms.  Patient and significant other at bedside agreeable with plan of care.   [LM]    Clinical Course User Index [LM] Jeannie FendMurphy, Xolani Degracia A, PA-C      Final Clinical Impressions(s) / ED Diagnoses   Final diagnoses:  Colitis    ED Discharge Orders         Ordered    ondansetron (ZOFRAN ODT) 4 MG disintegrating tablet  Every 8 hours PRN     10/14/18 1146           Alden HippMurphy, Yazhini Mcaulay A, PA-C 10/14/18 1157    Vanetta MuldersZackowski, Scott, MD 10/16/18 1600

## 2018-10-14 NOTE — Discharge Instructions (Signed)
Your CT scan today shows likely colitis which is an inflammation of your colon. You may take Zofran as needed as prescribed for nausea.  Recommend bland diet as tolerated. Follow-up with gastroenterology, number listed, call to schedule an appointment. Return to the emergency room for worsening or concerning symptoms.

## 2018-10-14 NOTE — ED Triage Notes (Signed)
Per Pt: Pt reports he went to his PCP this morning and had a positive fecal occult blood. Pt reports peri-umbilical pain and was referred to [facility] ED for work up and treatment.

## 2018-10-14 NOTE — ED Notes (Signed)
Patient transported to CT 

## 2019-04-22 ENCOUNTER — Other Ambulatory Visit: Payer: Self-pay

## 2019-04-22 ENCOUNTER — Ambulatory Visit (INDEPENDENT_AMBULATORY_CARE_PROVIDER_SITE_OTHER): Payer: No Typology Code available for payment source | Admitting: Allergy & Immunology

## 2019-04-22 ENCOUNTER — Encounter: Payer: Self-pay | Admitting: Allergy & Immunology

## 2019-04-22 VITALS — BP 128/82 | HR 74 | Temp 97.0°F | Resp 18 | Ht 67.2 in | Wt 205.0 lb

## 2019-04-22 DIAGNOSIS — T7800XD Anaphylactic reaction due to unspecified food, subsequent encounter: Secondary | ICD-10-CM | POA: Diagnosis not present

## 2019-04-22 DIAGNOSIS — R05 Cough: Secondary | ICD-10-CM | POA: Diagnosis not present

## 2019-04-22 DIAGNOSIS — J302 Other seasonal allergic rhinitis: Secondary | ICD-10-CM | POA: Diagnosis not present

## 2019-04-22 DIAGNOSIS — R059 Cough, unspecified: Secondary | ICD-10-CM

## 2019-04-22 DIAGNOSIS — J3089 Other allergic rhinitis: Secondary | ICD-10-CM

## 2019-04-22 MED ORDER — EPINEPHRINE 0.3 MG/0.3ML IJ SOAJ: 0.3000 mg | Freq: Once | INTRAMUSCULAR | 1 refills | Status: AC

## 2019-04-22 NOTE — Progress Notes (Signed)
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 postnasal drip   Neil White is a delightful young man presenting for an evaluation of chronic rhinitis as well as food allergies and a cough.  Regarding his food allergies, it seems that he has no evidence of seafood allergies.  We are going to confirm with the tilapia IgE to make sure that this is negative before proceeding with a challenge.  I would prefer the challenge to be in the office given his severe reaction.  Has been and it was negative, but he is not interested in reintroducing this anyway.  Conversely, the red meats are still very positive I recommended continued avoidance of these.  We are going to work on getting him an affordable epinephrine autoinjector given his severe allergies.  We provided an anaphylaxis management plan and indicated when he needed to use his device.  He was positive to multiple indoor and outdoor allergens.  We are going to aggressively treat this with antihistamines and nasal steroids to see if this will help with the cough.  Spirometry was thankfully normal so we will not pursue an asthma diagnosis at this point.   Plan/Recommendations:   1. 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 - Testing was very positive to pork, beef, and lamb (continue to avoid for sure). - Testing was negative to all of the seafood. - We are going to get blood testing for tilapia. - We will call you in 1 to 2 weeks for the results of the testing. - Because the reaction to the tilapia was so severe, I would like to do an in office challenge if the blood work is negative. - EpiPen training provided. -  Anaphylaxis management plan provided. - If the EpiPen is too expensive, give [place] a call back and we can send in something else.  2. Seasonal and perennial allergic rhinitis - Testing today showed: mouse, grasses, ragweed, weeds, trees, dust mites and dog - Copy of test results provided.   - Avoidance measures provided. - Stop taking: loratadine (this is a weak antihistamine) - Start taking: Zyrtec (cetirizine) 10mg  tablet once daily and Flonase (fluticasone) one spray per nostril daily (EVERY DAY, at least until the next visit - we want to see if this helps with the coughing) - You can use an extra dose of the antihistamine, if needed, for breakthrough symptoms.  - Consider nasal saline rinses 1-2 times daily to remove allergens from the nasal cavities as well as help with mucous clearance (this is especially helpful to do before the nasal sprays are given) - Consider allergy shots as a means of long-term control. - Allergy shots "re-train" and "reset" the immune system to ignore environmental allergens and decrease the resulting immune response to those allergens (sneezing, itchy watery eyes, runny nose, nasal congestion, etc).    - Allergy shots improve symptoms in 75-85% of patients.  - We can discuss more at the next appointment if the medications are not working for you.  3. Cough - Spirometry looked normal. - This might be related to postnasal drip, which would be answered after aggressive use of the Flonase. - Continue with albuterol 4 puffs  every 4-6 hours as needed.  4. Return in about 3 months (around 07/20/2019). This can be an in-person, a virtual Webex or a telephone follow up visit.   Subjective:   Neil White is a 29 y.o. male presenting today for evaluation of  Chief Complaint  Patient presents with  . Food Intolerance    + Alpha Gal and avoids fish, shellfish, bananas and carbonated beverages  . Allergies    Pollen    [name] has a history of the  following: Patient Active Problem List   Diagnosis Date Noted  . Chronic allergic rhinitis 03/30/2016  . Mild persistent asthma without complication 03/29/2016    History obtained from: chart review and patient.  Neil White was referred by Via, Neil Bee, MD.     Stone is a 29 y.o. male presenting for an evaluation of food allergies.  Fish - he ate tilapia at age 75 and had anaphylaxis. He went to the ED and required epinephrine. Everyone else in the family is allergic, so he decided to avoid all seafood.  Red meat - he was bit by a tick and developed alpha gal allergy around age 30. He did have an accidental ingestion when he ate vegetables in lard unknowingly. He did not have the reaction until 30 minutes later. He developed a lot of heat. He was tested in 2016 or 2017 at [facility].   Banana - he developed an allergy in 2016 or so. This reaction was more with chest pain midline. He does fine with mangos.   Carbonated beverages - with tonics, sodas, and soft drinks, he developed generalized erythema. There are no urticaria. He can tolerate beer, but he actually started developing some GI complaints with beer as well as coffee. He develops intense stomach pain and diarrhea and then frank blood. He has not had a colonoscopy. He did have a CT scan and was diagnosed with colitis. He had his diet switched up. Stool sample and blood work returned normal. This was all done in July 2020.    He tolerates cow's milk products without a problem. He tolerates, peanuts, tree nuts, and egg. He does does eat hummus a lot, so he tolerates sesame.   He does have some issues with environmental allergies in the past. He was last tested in 2012. He was never on allergy shots. He currently takes loratadine. He does not use a nose spray. He has been on the loratadine off and on for years. He will take it daily during the spring and then it starts to wean off during the spring.   He does  report "wheezing fits" that come and go. His last one was in 2017. He does not know for sure. He does have a persistent cough since age 62. He has had CXR which have been normal. He did see a pulmonologist at some point.   Otherwise, there is no history of other atopic diseases, including drug allergies, stinging insect allergies, eczema, urticaria or contact dermatitis. There is no significant infectious history. Vaccinations are up to date.    Past Medical History: Patient Active Problem List   Diagnosis Date Noted  . Chronic allergic rhinitis 03/30/2016  . Mild persistent asthma without complication 03/29/2016    Medication List:  Allergies as of 04/22/2019      Reactions   Other Anaphylaxis   All meats except chicken and [place]    Banana Other (See Comments)   Chest pain  Medication List       Accurate as of April 22, 2019 10:23 AM. If you have any questions, ask your nurse or doctor.        STOP taking these medications   montelukast 10 MG tablet Commonly known as: SINGULAIR Stopped by: Alfonse Spruce, MD   ondansetron 4 MG disintegrating tablet Commonly known as: Zofran ODT Stopped by: Alfonse Spruce, MD     TAKE these medications   albuterol 108 (90 Base) MCG/ACT inhaler Commonly known as: VENTOLIN HFA Inhale 2 puffs into the lungs every 6 (six) hours as needed for wheezing or shortness of breath.   EPINEPHrine 0.3 mg/0.3 mL Soaj injection Commonly known as: EPI-PEN Inject 0.3 mLs (0.3 mg total) into the muscle once for 1 dose. Started by: Alfonse Spruce, MD   famotidine 10 MG tablet Commonly known as: PEPCID Take 10 mg by mouth 2 (two) times daily as needed for heartburn or indigestion.   loratadine 10 MG tablet Commonly known as: CLARITIN Take 10 mg by mouth daily.       Birth History: non-contributory  Developmental History: non-contributory  Past Surgical History: Past Surgical History:  Procedure Laterality Date  .  ANKLE FRACTURE SURGERY    . LEG SURGERY       Family History: Family History  Problem Relation Age of Onset  . Hyperlipidemia Father   . Heart disease Father   . Diabetes Maternal Grandmother   . Cancer Maternal Grandfather   . Diabetes Paternal Grandfather   . Heart disease Paternal Grandfather   . Hyperlipidemia Paternal Grandfather   . Allergic rhinitis Neg Hx   . Angioedema Neg Hx   . Asthma Neg Hx   . Atopy Neg Hx   . Eczema Neg Hx   . Immunodeficiency Neg Hx   . Urticaria Neg Hx      Social History: Geral lives at home with his wife.  He currently works at a liquor store.  He previously was a Leisure centre manager.  In a townhouse that was built in the 70s.  There is gas heating and central and window units for cooling.  There are 2 dogs and 1 cat in the home.  There are dust mite covers on the bed, but not the pillows.  There is no tobacco exposure.  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    Review of Systems  Constitutional: Negative.  Negative for chills, fever, malaise/fatigue and weight loss.  HENT: Positive for congestion. Negative for ear discharge and ear pain.        Positive for postnasal drip.  Eyes: Negative for pain, discharge and redness.  Respiratory: Positive for cough. Negative for sputum production, shortness of breath and wheezing.   Cardiovascular: Negative.  Negative for chest pain and palpitations.  Gastrointestinal: Negative for abdominal pain, constipation, diarrhea, heartburn, nausea and vomiting.  Skin: Negative.  Negative for itching and rash.  Neurological: Negative for dizziness and headaches.  Endo/Heme/Allergies: Positive for environmental allergies. Does not bruise/bleed easily.       Positive for food allergies.       Objective:   Blood pressure 128/82, pulse 74, temperature (!) 97 F (36.1 C), temperature source Temporal, resp. rate 18, height 5' 7.2" (1.707 m), weight 205 lb (93 kg), SpO2 98 %. Body mass index is  31.92 kg/m.   Physical Exam:   Physical Exam  Constitutional: He appears well-developed.  Pleasant male.  Very talkative.  HENT:  Head: Normocephalic and atraumatic.  Right Ear: Tympanic membrane, external ear and ear canal normal. No drainage, swelling or tenderness. Tympanic membrane is not injected, not scarred, not erythematous, not retracted and not bulging.  Left Ear: Tympanic membrane, external ear and ear canal normal. No drainage, swelling or tenderness. Tympanic membrane is not injected, not scarred, not erythematous, not retracted and not bulging.  Nose: Mucosal edema and rhinorrhea present. No nasal deformity or septal deviation. No epistaxis. Right sinus exhibits no maxillary sinus tenderness and no frontal sinus tenderness. Left sinus exhibits no maxillary sinus tenderness and no frontal sinus tenderness.  Mouth/Throat: Uvula is midline and oropharynx is clear and moist. Mucous membranes are not pale and not dry.  There is some cobblestoning in the posterior oropharynx.  There is some mild sinus tenderness.  Eyes: Pupils are equal, round, and reactive to light. Conjunctivae and EOM are normal. Right eye exhibits no chemosis and no discharge. Left eye exhibits no chemosis and no discharge. Right conjunctiva is not injected. Left conjunctiva is not injected.  Allergic shiners bilaterally.  Cardiovascular: Normal rate, regular rhythm and normal heart sounds.  Respiratory: Effort normal and breath sounds normal. No accessory muscle usage. No tachypnea. No respiratory distress. He has no wheezes. He has no rhonchi. He has no rales. He exhibits no tenderness.  Moving air well in all lung fields.  No increased work of breathing.  GI: There is no abdominal tenderness. There is no rebound and no guarding.  Lymphadenopathy:       Head (right side): No submandibular, no tonsillar and no occipital adenopathy present.       Head (left side): No submandibular, no tonsillar and no occipital  adenopathy present.    He has cervical adenopathy.       Right cervical: Superficial cervical adenopathy present.       Left cervical: Superficial cervical adenopathy present.  Neurological: He is alert.  Skin: No abrasion, no petechiae and no rash noted. Rash is not papular, not vesicular and not urticarial. No erythema. No pallor.  Very impressive tattoos, especially along the right arm.  Psychiatric: He has a normal mood and affect.     Diagnostic studies:    Spirometry: results normal (FEV1: 4.86/117%, FVC: 5.46/109%, FEV1/FVC: 89%).    Spirometry consistent with normal pattern.   Allergy Studies:    Airborne Adult Perc - 04/22/19 0903    Time Antigen Placed  0211    Allergen Manufacturer  Waynette Buttery    Location  Back    Number of Test  59    Panel 1  Select    1. Control-Buffer 50% Glycerol  Negative    2. Control-Histamine 1 mg/ml  2+    3. Albumin saline  Negative    4. Bahia  2+    5. [place]  2+    6. Johnson  2+    7. Kentucky Blue  3+    8. Meadow Fescue  2+    9. Perennial Rye  3+    10. Sweet Vernal  Negative    11. Timothy  3+    12. Cocklebur  2+    13. Burweed Marshelder  2+    14. Ragweed, short  Negative    15. Ragweed, Giant  Negative    16. Plantain,  English  Negative    17. Lamb's Quarters  2+    18. Sheep Sorrell  2+    19. Rough Pigweed  2+    20. Michail Jewels  Elder, Rough  Negative    21. Mugwort, Common  2+    22. Ash mix  2+    23. Birch mix  Negative    24. Beech American  2+    25. Box, Elder  2+    26. Cedar, red  Negative    27. Cottonwood, [place]  Negative    28. Elm mix  Negative    29. Hickory mix  4+    30. Maple mix  Negative    31. Oak, [place] mix  Negative    32. Pecan Pollen  4+    33. Pine mix  2+    34. Sycamore Eastern  2+    35. Walnut, Black Pollen  3+    36. Alternaria alternata  Negative    37. Cladosporium Herbarum  Negative    38. Aspergillus mix  Negative    39. Penicillium mix  Negative    40. Bipolaris sorokiniana  (Helminthosporium)  Negative    41. Drechslera spicifera (Curvularia)  Negative    42. Mucor plumbeus  Negative    43. Fusarium moniliforme  Negative    44. Aureobasidium pullulans (pullulara)  Negative    45. Rhizopus oryzae  Negative    46. Botrytis cinera  Negative    47. Epicoccum nigrum  Negative    48. Phoma betae  Negative    49. Candida Albicans  Negative    50. Trichophyton mentagrophytes  Negative    51. Mite, D Farinae  5,000 AU/ml  4+    52. Mite, D Pteronyssinus  5,000 AU/ml  4+    53. Cat Hair 10,000 BAU/ml  Negative    54.  Dog Epithelia  4+    55. Mixed Feathers  Negative    56. Horse Epithelia  3+    57. Cockroach, German  Negative    58. Mouse  3+    59. Tobacco Leaf  Negative     Food Adult Perc - 04/22/19 0900    Time Antigen Placed  2774    Allergen Manufacturer  Waynette Buttery    Location  Back    Number of allergen test  16    18. Catfish  Negative    19. Bass  Negative    20. Trout  Negative    21. Tuna  Negative    22. Salmon  Negative    23. Flounder  Negative    24. Codfish  Negative    25. Shrimp  Negative    26. Crab  Negative    27. Lobster  Negative    28. Oyster  Negative    29. Scallops  Negative    37. Pork  2+    40. Beef  3+    57. Banana  Negative    Comments  LAMB: NEGATIVE       Allergy testing results were read and interpreted by myself, documented by clinical staff.         Malachi Bonds, MD [facility]

## 2019-04-22 NOTE — Patient Instructions (Addendum)
1. 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 - Testing was very positive to pork, beef, and lamb (continue to avoid for sure). - Testing was negative to all of the seafood. - We are going to get blood testing for tilapia. - We will call you in 1 to 2 weeks for the results of the testing. - Because the reaction to the tilapia was so severe, I would like to do an in office challenge if the blood work is negative. - EpiPen training provided. - Anaphylaxis management plan provided. - If the EpiPen is too expensive, give [place] a call back and we can send in something else.  2. Seasonal and perennial allergic rhinitis - Testing today showed: mouse, grasses, ragweed, weeds, trees, dust mites and dog - Copy of test results provided.  - Avoidance measures provided. - Stop taking: loratadine (this is a weak antihistamine) - Start taking: Zyrtec (cetirizine) 10mg  tablet once daily and Flonase (fluticasone) one spray per nostril daily (EVERY DAY, at least until the next visit - we want to see if this helps with the coughing) - You can use an extra dose of the antihistamine, if needed, for breakthrough symptoms.  - Consider nasal saline rinses 1-2 times daily to remove allergens from the nasal cavities as well as help with mucous clearance (this is especially helpful to do before the nasal sprays are given) - Consider allergy shots as a means of long-term control. - Allergy shots "re-train" and "reset" the immune system to ignore environmental allergens and decrease the resulting immune response to those allergens (sneezing, itchy watery eyes, runny nose, nasal congestion, etc).    - Allergy shots improve symptoms in 75-85% of patients.  - We can discuss more at the next appointment if the medications are not working for you.  3. Cough - Spirometry looked normal. - This might be related to postnasal drip, which would  be answered after aggressive use of the Flonase. - Continue with albuterol 4 puffs every 4-6 hours as needed.  4. Return in about 3 months (around 07/20/2019). This can be an in-person, a virtual Webex or a telephone follow up visit.   Please inform 09/19/2019 of any Emergency Department visits, hospitalizations, or changes in symptoms. Call us before going to the ED for breathing or allergy symptoms since we might be able to fit you in for a sick visit. Feel free to contact us anytime with any questions, problems, or concerns.  It was a pleasure to meet you today!  Websites that have reliable patient information: 1. American Academy of Asthma, Allergy, and Immunology: www.aaaai.org 2. Food Allergy Research and Education (FARE): foodallergy.org 3. Mothers of Asthmatics: http://www.asthmacommunitynetwork.org 4. American College of Allergy, Asthma, and Immunology: www.acaai.org   COVID-19 Vaccine Information can be found at: [place] For questions related to vaccine distribution or appointments, please email [email or web address] or call [phone].     "Like" [phone] on Facebook and Instagram for our latest updates!        Make sure you are registered to vote! If you have moved or changed any of your contact information, you will need to get this updated before voting!  In some cases, you MAY be able to register to vote online: [place]    Reducing Pollen Exposure  The American Academy of Allergy, Asthma and Immunology suggests the following steps to reduce your exposure to pollen during allergy seasons.  1. Do not hang sheets or clothing out to dry; pollen may collect on these items. 2. Do not mow lawns or spend time around freshly cut grass; mowing stirs up pollen. 3. Keep windows closed at night.  Keep car windows closed while driving. 4. Minimize morning activities outdoors,  a time when pollen counts are usually at their highest. 5. Stay indoors as much as possible when pollen counts or humidity is high and on windy days when pollen tends to remain in the air longer. 6. Use air conditioning when possible.  Many air conditioners have filters that trap the pollen spores. 7. Use a HEPA room air filter to remove pollen form the indoor air you breathe.   Control of Dog or Cat Allergen  Avoidance is the best way to manage a dog or cat allergy. If you have a dog or cat and are allergic to dog or cats, consider removing the dog or cat from the home. If you have a dog or cat but don't want to find it a new home, or if your family wants a pet even though someone in the household is allergic, here are some strategies that may help keep symptoms at bay:  1. Keep the pet out of your bedroom and restrict it to only a few rooms. Be advised that keeping the dog or cat in only one room will not limit the allergens to that room. 2. Don't pet, hug or kiss the dog or cat; if you do, wash your hands with soap and water. 3. High-efficiency particulate air (HEPA) cleaners run continuously in a bedroom or living room can reduce allergen levels over time. 4. Regular use of a high-efficiency vacuum cleaner or a central vacuum can reduce allergen levels. 5. Giving your dog or cat a bath at least once a week can reduce airborne allergen.  Control of Dust Mite Allergen    Dust mites play a major role in allergic asthma and rhinitis.  They occur in environments with high humidity wherever human skin is found.  Dust mites absorb humidity from the atmosphere (ie, they do not drink) and feed on organic matter (including shed human and animal skin).  Dust mites are a microscopic type of insect that you cannot see with the naked eye.  High levels of dust mites have been detected from mattresses, pillows, carpets, upholstered furniture, bed covers, clothes, soft toys and any woven material.  The  principal allergen of the dust mite is found in its feces.  A gram of dust may contain 1,000 mites and 250,000 fecal particles.  Mite antigen is easily measured in the air during house cleaning activities.  Dust mites do not bite and do not cause harm to humans, other than by triggering allergies/asthma.    Ways to decrease your exposure to dust mites in your home:  1. Encase mattresses, box springs and pillows with a mite-impermeable barrier or cover   2. Wash sheets, blankets and drapes weekly in hot water (130 F) with detergent and dry them in a dryer on the hot setting.  3. Have the room cleaned frequently with a vacuum cleaner and a damp dust-mop.  For carpeting or rugs, vacuuming with a vacuum cleaner equipped with a high-efficiency particulate air (HEPA) filter.  The dust mite allergic individual should not be in a room which is being cleaned and should wait 1 hour after cleaning before going into the room. 4. Do not sleep on upholstered furniture (eg, couches).   5.  If possible removing carpeting, upholstered furniture and drapery from the home is ideal.  Horizontal blinds should be eliminated in the rooms where the person spends the most time (bedroom, study, television room).  Washable vinyl, roller-type shades are optimal. 6. Remove all non-washable stuffed toys from the bedroom.  Wash stuffed toys weekly like sheets and blankets above.   7. Reduce indoor humidity to less than 50%.  Inexpensive humidity monitors can be purchased at most hardware stores.  Do not use a humidifier as can make the problem worse and are not recommended.  Allergy Shots   Allergies are the result of a chain reaction that starts in the immune system. Your immune system controls how your body defends itself. For instance, if you have an allergy to pollen, your immune system identifies pollen as an invader or allergen. Your immune system overreacts by producing antibodies called Immunoglobulin E (IgE). These  antibodies travel to cells that release chemicals, causing an allergic reaction.  The concept behind allergy immunotherapy, whether it is received in the form of shots or tablets, is that the immune system can be desensitized to specific allergens that trigger allergy symptoms. Although it requires time and patience, the payback can be long-term relief.  How Do Allergy Shots Work?  Allergy shots work much like a vaccine. Your body responds to injected amounts of a particular allergen given in increasing doses, eventually developing a resistance and tolerance to it. Allergy shots can lead to decreased, minimal or no allergy symptoms.  There generally are two phases: build-up and maintenance. Build-up often ranges from three to six months and involves receiving injections with increasing amounts of the allergens. The shots are typically given once or twice a week, though more rapid build-up schedules are sometimes used.  The maintenance phase begins when the most effective dose is reached. This dose is different for each person, depending on how allergic you are and your response to the build-up injections. Once the maintenance dose is reached, there are longer periods between injections, typically two to four weeks.  Occasionally doctors give cortisone-type shots that can temporarily reduce allergy symptoms. These types of shots are different and should not be confused with allergy immunotherapy shots.  Who Can Be Treated with Allergy Shots?  Allergy shots may be a good treatment approach for people with allergic rhinitis (hay fever), allergic asthma, conjunctivitis (eye allergy) or stinging insect allergy.   Before deciding to begin allergy shots, you should consider:  . The length of allergy season and the severity of your symptoms . Whether medications and/or changes to your environment can control your symptoms . Your desire to avoid long-term medication use . Time: allergy immunotherapy  requires a major time commitment . Cost: may vary depending on your insurance coverage  Allergy shots for children age 72 and older are effective and often well tolerated. They might prevent the onset of new allergen sensitivities or the progression to asthma.  Allergy shots are not started on patients who are pregnant but can be continued on patients who become pregnant while receiving them. In some patients with other medical conditions or who take certain common medications, allergy shots may be of risk. It is important to mention other medications you talk to your allergist.   When Will I Feel Better?  Some may experience decreased allergy symptoms during the build-up phase. For others, it may take as long as 12 months on the maintenance dose. If there is no improvement after a year of maintenance,  your allergist will discuss other treatment options with you.  If you aren't responding to allergy shots, it may be because there is not enough dose of the allergen in your vaccine or there are missing allergens that were not identified during your allergy testing. Other reasons could be that there are high levels of the allergen in your environment or major exposure to non-allergic triggers like tobacco smoke.  What Is the Length of Treatment?  Once the maintenance dose is reached, allergy shots are generally continued for three to five years. The decision to stop should be discussed with your allergist at that time. Some people may experience a permanent reduction of allergy symptoms. Others may relapse and a longer course of allergy shots can be considered.  What Are the Possible Reactions?  The two types of adverse reactions that can occur with allergy shots are local and systemic. Common local reactions include very mild redness and swelling at the injection site, which can happen immediately or several hours after. A systemic reaction, which is less common, affects the entire body or a  particular body system. They are usually mild and typically respond quickly to medications. Signs include increased allergy symptoms such as sneezing, a stuffy nose or hives.  Rarely, a serious systemic reaction called anaphylaxis can develop. Symptoms include swelling in the throat, wheezing, a feeling of tightness in the chest, nausea or dizziness. Most serious systemic reactions develop within 30 minutes of allergy shots. This is why it is strongly recommended you wait in your doctor's office for 30 minutes after your injections. Your allergist is trained to watch for reactions, and his or her staff is trained and equipped with the proper medications to identify and treat them.  Who Should Administer Allergy Shots?  The preferred location for receiving shots is your prescribing allergist's office. Injections can sometimes be given at another facility where the physician and staff are trained to recognize and treat reactions, and have received instructions by your prescribing allergist.

## 2019-04-24 LAB — ALLERGEN TILAPIA F414: Allergen Tilapia IgE: <0.1 kU/L

## 2019-07-20 ENCOUNTER — Ambulatory Visit: Payer: Self-pay | Admitting: Allergy & Immunology

## 2020-07-09 IMAGING — CT CT ABDOMEN AND PELVIS WITH CONTRAST
2 of 4 series · 16 of 46 positions shown, 18 images · IV contrast (ISOVUE)
Comparison: None.

CLINICAL DATA: Acute generalized abdominal pain beginning last
night.

EXAM:
CT ABDOMEN AND PELVIS WITH CONTRAST
TECHNIQUE: Multidetector CT imaging of the abdomen and pelvis was performed
using the standard protocol following bolus administration of
intravenous contrast.
CONTRAST:  100mL OMNIPAQUE IOHEXOL 300 MG/ML  SOLN

[Series 2: axial st · axial · 0.87mm/px · z∈[-505,-105]mm · 13 of 91 slices shown, 15 images]
[im 6/91  soft-tissue]
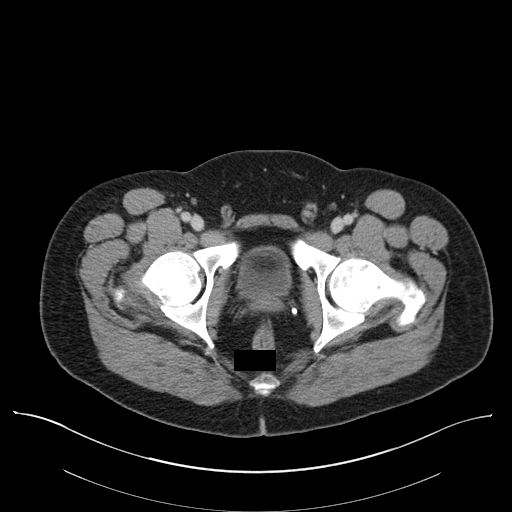
[im 6/91  bone]
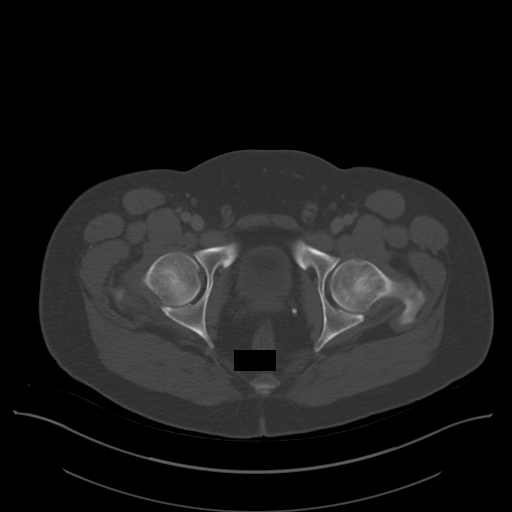
[im 11/91  soft-tissue]
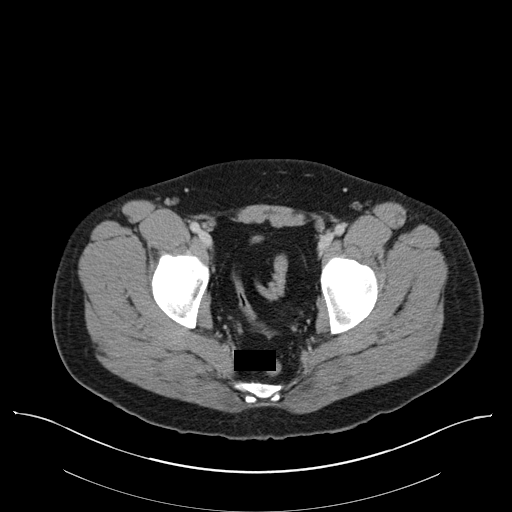
[im 21/91  soft-tissue]
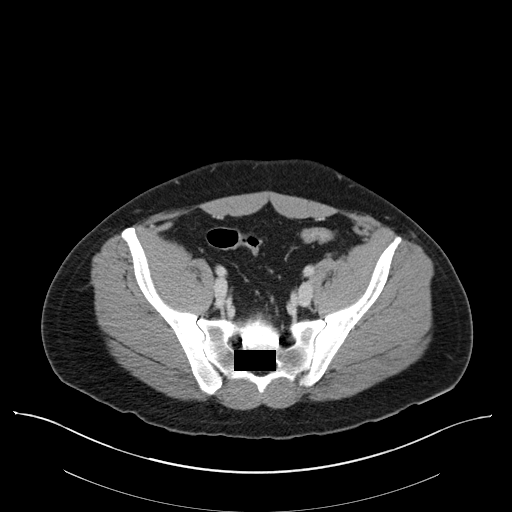
[im 26/91  soft-tissue]
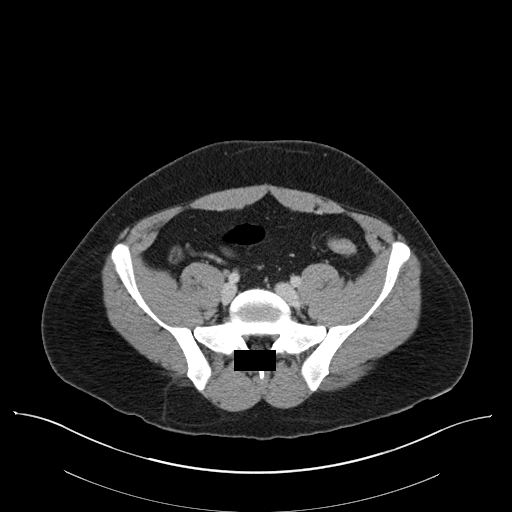
[im 31/91  soft-tissue]
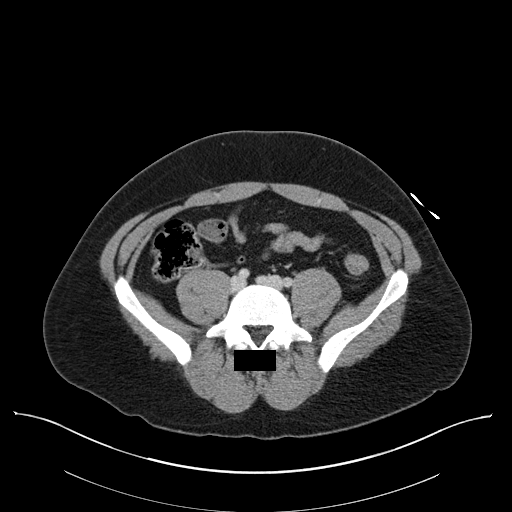
[im 41/91  soft-tissue]
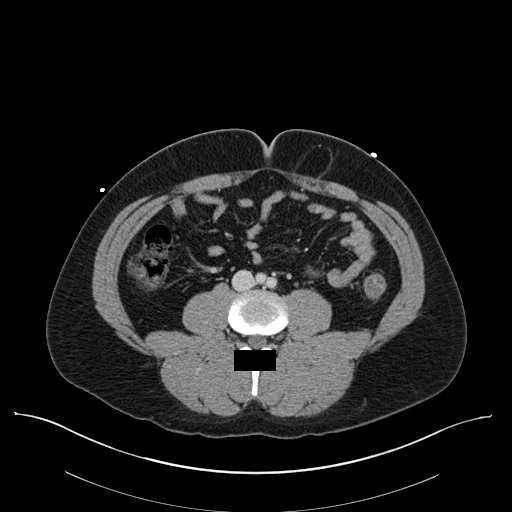
[im 46/91  soft-tissue]
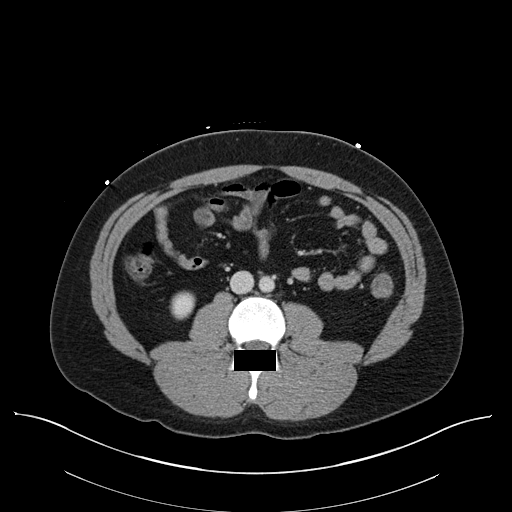
[im 51/91  soft-tissue]
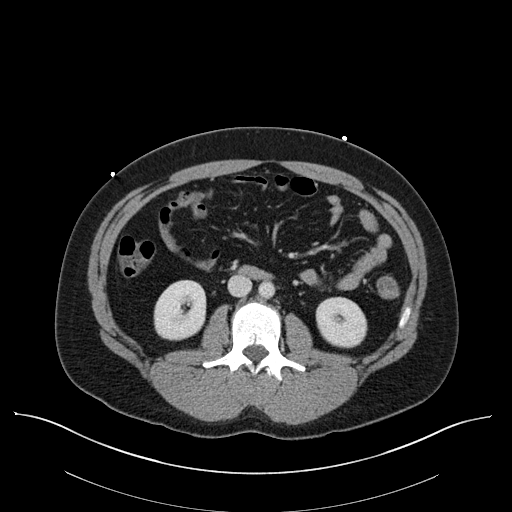
[im 61/91  soft-tissue]
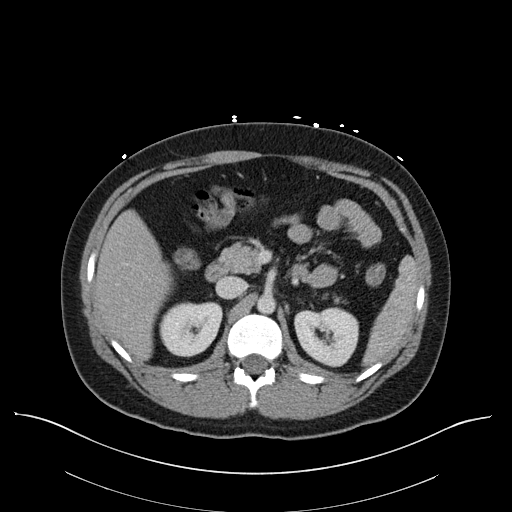
[im 61/91  bone]
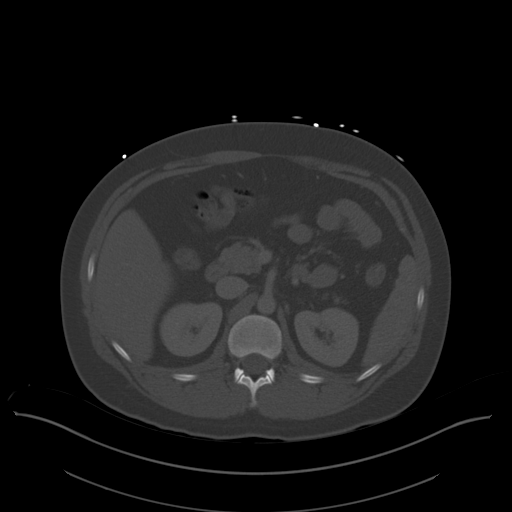
[im 66/91  soft-tissue]
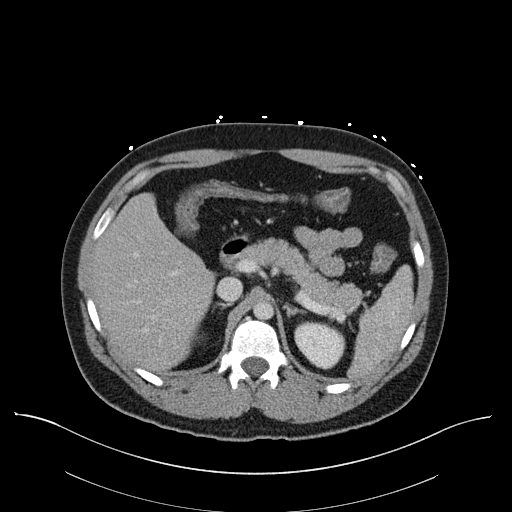
[im 71/91  soft-tissue]
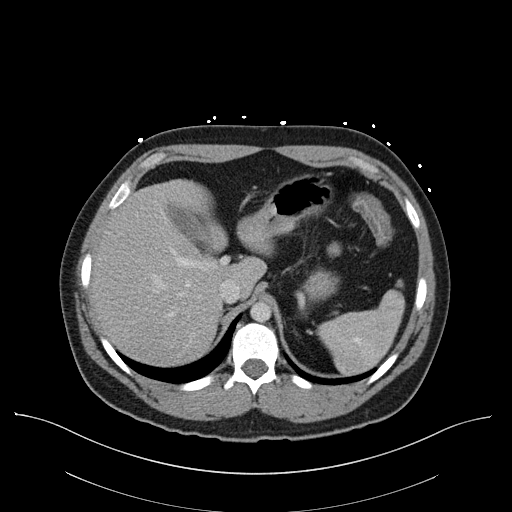
[im 81/91  soft-tissue]
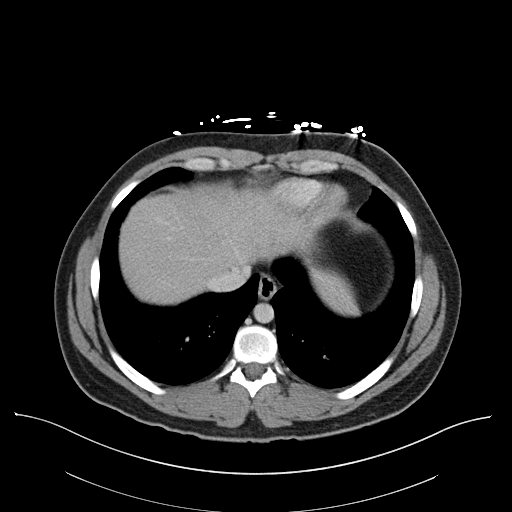
[im 86/91  soft-tissue]
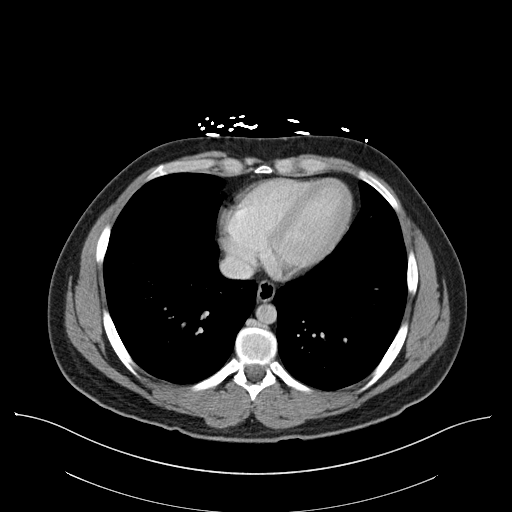

[Series 5: coronal st · coronal · 0.71mm/px · 3 of 136 slices shown]
[im 46/136  soft-tissue]
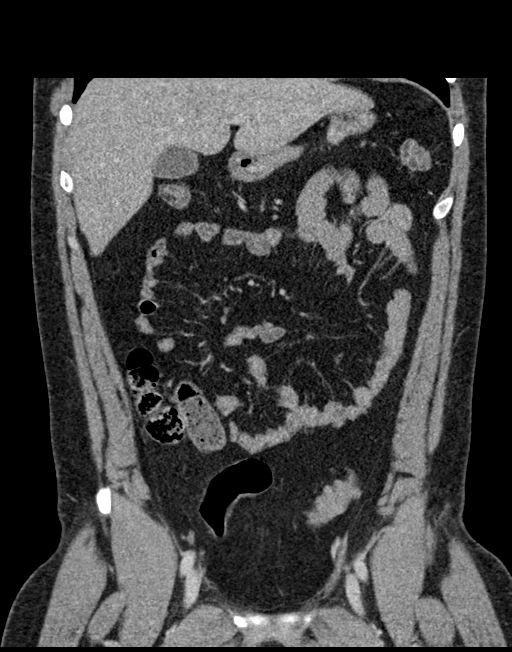
[im 61/136  soft-tissue]
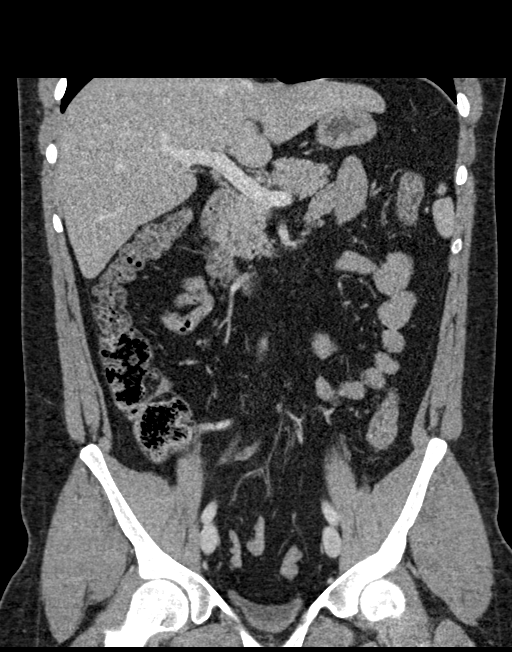
[im 76/136  soft-tissue]
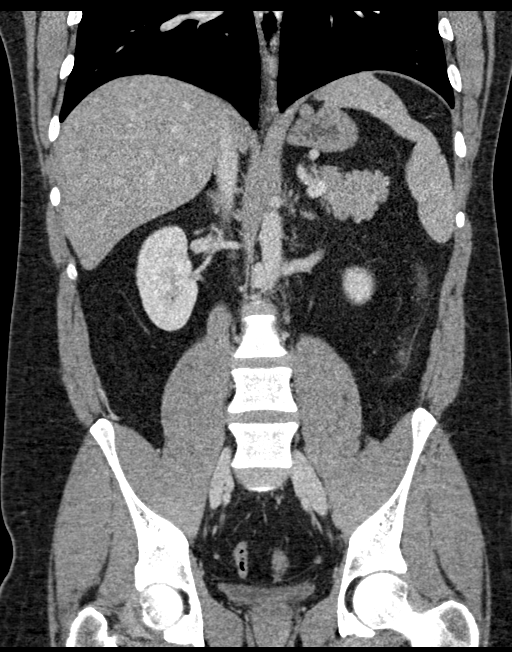

[16 of 46 positions shown; findings below may reference images not displayed]

FINDINGS: Lower chest: Normal

Hepatobiliary: Normal

Pancreas: Normal

Spleen: Normal

Adrenals/Urinary Tract: Adrenal glands are normal. Kidneys are
normal. Bladder is normal.

Stomach/Bowel: No evidence of bowel obstruction. The appendix is
normal. One could question mild wall thickening of the left colon
that could go along with colitis. This is not advanced. Findings
could be due to inflammatory bowel disease or infectious colitis

Vascular/Lymphatic: Normal

Reproductive: Normal

Other: No free fluid or air.

Musculoskeletal: Normal
IMPRESSION: Mild but probably real wall thickening of the left colon suggesting
either inflammatory or infectious colitis. Otherwise normal scan.

## 2020-11-07 ENCOUNTER — Other Ambulatory Visit: Payer: Self-pay | Admitting: Physician Assistant

## 2020-11-07 DIAGNOSIS — R599 Enlarged lymph nodes, unspecified: Secondary | ICD-10-CM

## 2020-12-01 ENCOUNTER — Other Ambulatory Visit: Payer: No Typology Code available for payment source

## 2021-02-28 ENCOUNTER — Encounter (HOSPITAL_BASED_OUTPATIENT_CLINIC_OR_DEPARTMENT_OTHER): Payer: Self-pay | Admitting: Family Medicine

## 2021-02-28 ENCOUNTER — Other Ambulatory Visit: Payer: Self-pay

## 2021-02-28 ENCOUNTER — Ambulatory Visit (INDEPENDENT_AMBULATORY_CARE_PROVIDER_SITE_OTHER): Payer: Managed Care, Other (non HMO) | Admitting: Family Medicine

## 2021-02-28 VITALS — BP 128/82 | HR 79 | Ht 67.0 in | Wt 220.0 lb

## 2021-02-28 DIAGNOSIS — E785 Hyperlipidemia, unspecified: Secondary | ICD-10-CM

## 2021-02-28 DIAGNOSIS — F419 Anxiety disorder, unspecified: Secondary | ICD-10-CM | POA: Diagnosis not present

## 2021-02-28 DIAGNOSIS — R030 Elevated blood-pressure reading, without diagnosis of hypertension: Secondary | ICD-10-CM | POA: Diagnosis not present

## 2021-02-28 DIAGNOSIS — F32A Depression, unspecified: Secondary | ICD-10-CM

## 2021-02-28 HISTORY — DX: Hyperlipidemia, unspecified: E78.5

## 2021-02-28 HISTORY — DX: Elevated blood-pressure reading, without diagnosis of hypertension: R03.0

## 2021-02-28 HISTORY — DX: Anxiety disorder, unspecified: F41.9

## 2021-02-28 NOTE — Progress Notes (Signed)
New Patient Office Visit  Subjective:  Patient ID: Neil White, male    DOB: Jul 18, 1990  Age: 30 y.o. MRN: [id number]  CC:  Chief Complaint  Patient presents with   Establish Care    Former PCP - [facility]   Hypertension    Patient states he has been told his BP was "borderline" in the past. He states he notices at time his resting HR is elevated (90-100) and when it is he feels his chest tighten and he is short of breath.    Sleep Apnea    Patient recently did an at home sleep study with sleep med and was told he has moderate to sever sleep apnea. He is scheduled to be fitted with a CPAP machine tomorrow    HPI Neil White is a 30 yo male presenting to establish in clinic.  He has current concerns as outlined above.  Past medical history significant for sleep apnea, elevated blood pressure without diagnosis of hypertension, hyperlipidemia/hypertriglyceridemia.  Sleep apnea: diagnosed last Thursday with home sleep study, has CPAP titration being completed tomorrow. Is following with Sleep Med regarding this.  Elevated blood pressure: has had elevated BP readings in the past - has been told in the past that it is borderline. Does have family history of high blood pressure.   Hyperlipidemia/hypertriglyceridemia: currently taking fenofibrate which he started about 4 months ago, denies statin use in the past.  On further discussion, patient does indicate past history of anxiety/depression.  He does report a suicide attempt in 2017.  At present, he denies any thoughts of harming himself or anyone else.  Does see a therapist currently, approximately every other week.  Was only briefly on medication in the past, he thinks during high school, unsure of what medication he was taking.  He would be interested in referral to psychiatrist.  Indicates that he has not established with 1 previously due to lack of medical insurance.  Some FH of DM, HTN Thinks last lab work was in  July/August, around same time he had wellness visit Patient works as an Personnel officer. Originally from [place], [place]. Has been living in the area for almost 20 years. Outside of work, patient enjoys playing pool, video games.  Past Medical History:  Diagnosis Date   Allergy to alpha-gal    Asthma    Bronchitis    Sleep apnea     Past Surgical History:  Procedure Laterality Date   ANKLE FRACTURE SURGERY     LEG SURGERY      Family History  Problem Relation Age of Onset   Hyperlipidemia Father    Heart disease Father    Diabetes Maternal Grandmother    Cancer Maternal Grandfather    Diabetes Paternal Grandfather    Heart disease Paternal Grandfather    Hyperlipidemia Paternal Grandfather    Allergic rhinitis Neg Hx    Angioedema Neg Hx    Asthma Neg Hx    Atopy Neg Hx    Eczema Neg Hx    Immunodeficiency Neg Hx    Urticaria Neg Hx     Social History   Socioeconomic History   Marital status: Married    Spouse name: Not on file   Number of children: Not on file   Years of education: Not on file   Highest education level: Not on file  Occupational History   Not on file  Tobacco Use   Smoking status: Never   Smokeless tobacco: Never  Vaping Use   Vaping  Use: Never used  Substance and Sexual Activity   Alcohol use: Yes    Comment: 3 mixed drinks a week   Drug use: No   Sexual activity: Yes  Other Topics Concern   Not on file  Social History Narrative   Not on file   Social Determinants of Health   Financial Resource Strain: Not on file  Food Insecurity: Not on file  Transportation Needs: Not on file  Physical Activity: Not on file  Stress: Not on file  Social Connections: Not on file  Intimate Partner Violence: Not on file    Objective:   Today's Vitals: BP 128/82    Pulse 79    Ht 5\' 7"  (1.702 m)    Wt 220 lb (99.8 kg)    SpO2 100%    BMI 34.46 kg/m   Physical Exam  30 year old male in no acute distress Cardiovascular exam with regular rate and  rhythm, no murmurs appreciated Lungs clear to auscultation bilaterally  Assessment & Plan:   Problem List Items Addressed This Visit       Other   Anxiety and depression    Prior history of this, currently engaged in counseling Has been on pharmacotherapy in the past, was briefly and at least 10 years ago.  Would be interested in discussing with psychiatrist additional treatment options, referral placed      Relevant Orders   Ambulatory referral to Psychiatry   CBC with Differential/Platelet   Comprehensive metabolic panel   TSH Rfx on Abnormal to Free T4   Elevated blood pressure reading without diagnosis of hypertension    Blood pressure elevated in the past, discussed general measures to aid in blood pressure control, specifically DASH diet, regular aerobic exercise Encouraged patient to monitor blood pressure intermittently at home, will follow-up on blood pressure at future office visits      Relevant Orders   Ambulatory referral to Psychiatry   CBC with Differential/Platelet   Comprehensive metabolic panel   Hemoglobin A1c   Hyperlipidemia - Primary    Notes elevated cholesterol on prior labs as well as triglycerides Has been taking fenofibrate Denies any prior statin use Long discussion regarding cholesterol management as well as in setting of family history of heart disease as well as consideration of CAC scoring in the future if warranted      Relevant Medications   EPINEPHrine (AUVI-Q) 0.3 mg/0.3 mL IJ SOAJ injection   fenofibrate (TRICOR) 48 MG tablet   Other Relevant Orders   Lipid panel    Outpatient Encounter Medications as of 02/28/2021  Medication Sig   EPINEPHrine (AUVI-Q) 0.3 mg/0.3 mL IJ SOAJ injection See admin instructions.   fenofibrate (TRICOR) 48 MG tablet Take 1 tablet by mouth daily.   albuterol (PROVENTIL HFA;VENTOLIN HFA) 108 (90 Base) MCG/ACT inhaler Inhale 2 puffs into the lungs every 6 (six) hours as needed for wheezing or shortness of  breath.   famotidine (PEPCID) 10 MG tablet Take 10 mg by mouth 2 (two) times daily as needed for heartburn or indigestion.   loratadine (CLARITIN) 10 MG tablet Take 10 mg by mouth daily.   No facility-administered encounter medications on file as of 02/28/2021.    Follow-up: Return in about 2 months (around 05/01/2021) for Follow Up.  Plan for CPE in about 2 months with labs completed 1 week prior  Bernyce Brimley J De 05/03/2021, MD

## 2021-02-28 NOTE — Assessment & Plan Note (Signed)
Blood pressure elevated in the past, discussed general measures to aid in blood pressure control, specifically DASH diet, regular aerobic exercise Encouraged patient to monitor blood pressure intermittently at home, will follow-up on blood pressure at future office visits

## 2021-02-28 NOTE — Patient Instructions (Signed)
°  Medication Instructions:  Your physician recommends that you continue on your current medications as directed. Please refer to the Current Medication list given to you today. --If you need a refill on any your medications before your next appointment, please call your pharmacy first. If no refills are authorized on file call the office.-- Referrals/Procedures/Imaging: A referral has been placed for you to [facility] for evaluation and treatment. Someone from the scheduling department will be in contact with you in regards to coordinating your consultation. If you do not hear from any of the schedulers within 7-10 business days please give their office a call.  [facility]  [address], [place], [place] [address] Phone: [phone]  Follow-Up: Your next appointment:   Your physician recommends that you schedule a follow-up appointment in: 1-2 MONTHS for a CPE with Dr. de [place]  You will receive a text message or e-mail with a link to a survey about your care and experience with [place] today! We would greatly appreciate your feedback!   Thanks for letting us be apart of your health journey!!  Primary Care and Sports Medicine   Dr. Ceasar Mons [place]   We encourage you to activate your patient portal called "MyChart".  Sign up information is provided on this After Visit Summary.  MyChart is used to connect with patients for Virtual Visits (Telemedicine).  Patients are able to view lab/test results, encounter notes, upcoming appointments, etc.  Non-urgent messages can be sent to your provider as well. To learn more about what you can do with MyChart, please visit --  [email or web address].

## 2021-02-28 NOTE — Assessment & Plan Note (Signed)
Notes elevated cholesterol on prior labs as well as triglycerides Has been taking fenofibrate Denies any prior statin use Long discussion regarding cholesterol management as well as in setting of family history of heart disease as well as consideration of CAC scoring in the future if warranted

## 2021-02-28 NOTE — Assessment & Plan Note (Signed)
Prior history of this, currently engaged in counseling Has been on pharmacotherapy in the past, was briefly and at least 10 years ago.  Would be interested in discussing with psychiatrist additional treatment options, referral placed

## 2021-04-25 ENCOUNTER — Ambulatory Visit (HOSPITAL_BASED_OUTPATIENT_CLINIC_OR_DEPARTMENT_OTHER): Payer: Managed Care, Other (non HMO)

## 2021-05-01 ENCOUNTER — Encounter (HOSPITAL_BASED_OUTPATIENT_CLINIC_OR_DEPARTMENT_OTHER): Payer: Managed Care, Other (non HMO) | Admitting: Family Medicine

## 2021-11-09 HISTORY — PX: VASECTOMY: SHX75

## 2021-12-13 ENCOUNTER — Ambulatory Visit: Payer: Managed Care, Other (non HMO) | Attending: Cardiology | Admitting: Cardiology

## 2021-12-31 ENCOUNTER — Ambulatory Visit: Payer: Managed Care, Other (non HMO) | Attending: Cardiology | Admitting: Cardiology

## 2021-12-31 ENCOUNTER — Ambulatory Visit: Payer: Managed Care, Other (non HMO) | Attending: Cardiology

## 2021-12-31 ENCOUNTER — Encounter: Payer: Self-pay | Admitting: Cardiology

## 2021-12-31 VITALS — BP 110/88 | HR 78 | Ht 67.0 in | Wt 221.2 lb

## 2021-12-31 DIAGNOSIS — R0789 Other chest pain: Secondary | ICD-10-CM

## 2021-12-31 DIAGNOSIS — E782 Mixed hyperlipidemia: Secondary | ICD-10-CM

## 2021-12-31 DIAGNOSIS — R002 Palpitations: Secondary | ICD-10-CM | POA: Diagnosis not present

## 2021-12-31 DIAGNOSIS — F419 Anxiety disorder, unspecified: Secondary | ICD-10-CM

## 2021-12-31 DIAGNOSIS — R0609 Other forms of dyspnea: Secondary | ICD-10-CM

## 2021-12-31 DIAGNOSIS — F32A Depression, unspecified: Secondary | ICD-10-CM

## 2021-12-31 HISTORY — DX: Palpitations: R00.2

## 2021-12-31 HISTORY — DX: Other chest pain: R07.89

## 2021-12-31 NOTE — Patient Instructions (Signed)
Medication Instructions:  Your physician recommends that you continue on your current medications as directed. Please refer to the Current Medication list given to you today.  *If you need a refill on your cardiac medications before your next appointment, please call your pharmacy*   Lab Work: None Ordered If you have labs (blood work) drawn today and your tests are completely normal, you will receive your results only by: MyChart Message (if you have MyChart) OR A paper copy in the mail If you have any lab test that is abnormal or we need to change your treatment, we will call you to review the results.   Testing/Procedures: Your physician has requested that you have an echocardiogram. Echocardiography is a painless test that uses sound waves to create images of your heart. It provides your doctor with information about the size and shape of your heart and how well your heart's chambers and valves are working. This procedure takes approximately one hour. There are no restrictions for this procedure. Please do NOT wear cologne, perfume, aftershave, or lotions (deodorant is allowed). Please arrive 15 minutes prior to your appointment time.    WHY IS MY DOCTOR PRESCRIBING ZIO? The Zio system is proven and trusted by physicians to detect and diagnose irregular heart rhythms -- and has been prescribed to hundreds of thousands of patients.  The FDA has cleared the Zio system to monitor for many different kinds of irregular heart rhythms. In a study, physicians were able to reach a diagnosis 90% of the time with the Zio system1.  You can wear the Zio monitor -- a small, discreet, comfortable patch -- during your normal day-to-day activity, including while you sleep, shower, and exercise, while it records every single heartbeat for analysis.  1Barrett, P., et al. Comparison of 24 Hour Holter Monitoring Versus 14 Day Novel Adhesive Patch Electrocardiographic Monitoring. American Journal of  Medicine, 2014.  ZIO VS. HOLTER MONITORING The Zio monitor can be comfortably worn for up to 14 days. Holter monitors can be worn for 24 to 48 hours, limiting the time to record any irregular heart rhythms you may have. Zio is able to capture data for the 51% of patients who have their first symptom-triggered arrhythmia after 48 hours.1  LIVE WITHOUT RESTRICTIONS The Zio ambulatory cardiac monitor is a small, unobtrusive, and water-resistant patch--you might even forget you're wearing it. The Zio monitor records and stores every beat of your heart, whether you're sleeping, working out, or showering.     Follow-Up: At [facility], you and your health needs are our priority.  As part of our continuing mission to provide you with exceptional heart care, we have created designated Provider Care Teams.  These Care Teams include your primary Cardiologist (physician) and Advanced Practice Providers (APPs -  Physician Assistants and Nurse Practitioners) who all work together to provide you with the care you need, when you need it.  We recommend signing up for the patient portal called "MyChart".  Sign up information is provided on this After Visit Summary.  MyChart is used to connect with patients for Virtual Visits (Telemedicine).  Patients are able to view lab/test results, encounter notes, upcoming appointments, etc.  Non-urgent messages can be sent to your provider as well.   To learn more about what you can do with MyChart, go to [email or web address].    Your next appointment:   3 month(s)  The format for your next appointment:   In Person  Provider:   Robert Krasowski, MD      Other Instructions NA  

## 2021-12-31 NOTE — Progress Notes (Signed)
Cardiology Consultation:    Date:  12/31/2021   ID:  Neil White, DOB [birth year], MRN [id number]  PCP:  de White, Neil J, MD  Cardiologist:  Gypsy Balsam, MD   Referring MD: de White, Neil Kos, MD   Chief Complaint  Patient presents with   fam h/o heart disease    Heart Eval    History of Present Illness:    Neil White is a 31 y.o. male who is being seen today for the evaluation of palpitations, dyspnea on exertion at the request of de White, Neil Kos, MD.past medical history significant for anxiety and depression, dyslipidemia, hypertriglyceridemia, family history of premature coronary artery disease.  He would like to be established patient my practice for evaluation of his coronary artery risk factors.  He is worried because his grandfather had myocardial infarction at early age the same happened to his father.  However his father was a heavy smoker. 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  He did have some episode of chest pain few months ago he ended up being in the emergency room evaluation was unrevealing since that time no more chest pain.  Chief complaint that he had today is the fact that sometimes he feel his heart stopping or may be strong beat sometimes his breath is taking away sometimes he described to have more sustained arrhythmia palpitations however that lasts only for few seconds.  He denies have any swelling of lower extremities, no paroxysmal nocturnal dyspnea.  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  Past Medical History:  Diagnosis Date   Allergy to alpha-gal    Asthma    Bronchitis    Sleep apnea     Past Surgical History:  Procedure Laterality Date   ANKLE FRACTURE SURGERY     LEG SURGERY     VASECTOMY      Current Medications: Current  Meds  Medication Sig   albuterol (PROVENTIL HFA;VENTOLIN HFA) 108 (90 Base) MCG/ACT inhaler Inhale 2 puffs into the lungs every 6 (six) hours as needed for wheezing or shortness of breath.   calcium carbonate (TUMS - DOSED IN MG ELEMENTAL CALCIUM) 500 MG chewable tablet Chew 1 tablet by mouth daily as needed for indigestion or heartburn.   EPINEPHrine (AUVI-Q) 0.3 mg/0.3 mL IJ SOAJ injection Inject 0.3 mg into the muscle as needed for anaphylaxis.   famotidine (PEPCID) 10 MG tablet Take 10 mg by mouth 2 (two) times daily as needed for heartburn or indigestion.   loratadine (CLARITIN) 10 MG tablet Take 10 mg by mouth daily.   rosuvastatin (CRESTOR) 20 MG tablet Take 20 mg by mouth daily.   [DISCONTINUED] fenofibrate (TRICOR) 48 MG tablet Take 1 tablet by mouth daily.     Allergies:   Alpha-gal, Fish-derived products, Other, Banana, and Shellfish-derived products   Social History   Socioeconomic History   Marital status: Married    Spouse name: Not on file   Number of children: Not on file   Years of education: Not on file   Highest education level: Not on file  Occupational History   Not on file  Tobacco Use   Smoking status: Never   Smokeless tobacco: Never  Vaping Use   Vaping Use: Never used  Substance and Sexual Activity  Alcohol use: Yes    Comment: 3 mixed drinks a week   Drug use: No   Sexual activity: Yes  Other Topics Concern   Not on file  Social History Narrative   Not on file   Social Determinants of Health   Financial Resource Strain: Not on file  Food Insecurity: Not on file  Transportation Needs: Not on file  Physical Activity: Not on file  Stress: Not on file  Social Connections: Not on file     Family History: The patient's family history includes Cancer in his maternal grandfather; Diabetes in his maternal grandmother and paternal grandfather; Heart disease in his father and paternal grandfather; Hyperlipidemia in his father and paternal grandfather.  There is no history of Allergic rhinitis, Angioedema, Asthma, Atopy, Eczema, Immunodeficiency, or Urticaria. ROS:   Please see the history of present illness.    All 14 point review of systems negative except as described per history of present illness.  EKGs/Labs/Other Studies Reviewed:    The following studies were reviewed today:   EKG:  EKG is  ordered today.  The ekg ordered today demonstrates normal sinus rhythm, normal P interval, normal QS complex duration fulgent no ST segment changes  Recent Labs: No results found for requested labs within last 365 days.  Recent Lipid Panel No results found for: "CHOL", "TRIG", "HDL", "CHOLHDL", "VLDL", "LDLCALC", "LDLDIRECT"  Physical Exam:    VS:  BP 110/88 (BP Location: Left Arm, Patient Position: Sitting)   Pulse 78   Ht 5\' 7"  (1.702 m)   Wt 221 lb 3.2 oz (100.3 kg)   SpO2 (!) 78%   BMI 34.64 kg/m     Wt Readings from Last 3 Encounters:  12/31/21 221 lb 3.2 oz (100.3 kg)  02/28/21 220 lb (99.8 kg)  04/22/19 205 lb (93 kg)     GEN:  Well nourished, well developed in no acute distress HEENT: Normal NECK: No JVD; No carotid bruits LYMPHATICS: No lymphadenopathy CARDIAC: RRR, no murmurs, no rubs, no gallops RESPIRATORY:  Clear to auscultation without rales, wheezing or rhonchi  ABDOMEN: Soft, non-tender, non-distended MUSCULOSKELETAL:  No edema; No deformity  SKIN: Warm and dry NEUROLOGIC:  Alert and oriented x 3 PSYCHIATRIC:  Normal affect   ASSESSMENT:    1. Mixed hyperlipidemia   2. Palpitations   3. Atypical chest pain   4. Anxiety and depression    PLAN:    In order of problems listed above:  Palpitations.  I will ask him to wear Zio patch for 2 weeks to see exactly what can of arrhythmia with dealing with.  I suspect he gets some extrasystole and may be small dose of beta-blocker will be beneficial however with his history of depression I will be reluctant to initiate right away this medication as a part of  evaluation echocardiogram will be done make sure that his heart is structurally normal. Dyspnea on exertion, echocardiogram will be done to assess left ventricle ejection fraction Mixed dyslipidemia I did review his K PN which showed me his LDL of 81 HDL 35 his triglycerides 242.  He is taking rosuvastatin 20.  That said fairly controlled cholesterol triglycerides could be better however explained to him that avoidance of simple carbohydrates will help to control his triglycerides better.  He tells me that he drinks a lot of sweet tea I told him he had to stop and that should improve the situation. Anxiety and depression that be followed by internal medicine team   Medication Adjustments/Labs  and Tests Ordered: Current medicines are reviewed at length with the patient today.  Concerns regarding medicines are outlined above.  No orders of the defined types were placed in this encounter.  No orders of the defined types were placed in this encounter.   Signed, Park Liter, MD, [facility]. 12/31/2021 9:58 AM    [place]

## 2022-01-15 ENCOUNTER — Ambulatory Visit: Payer: Managed Care, Other (non HMO) | Attending: Cardiology

## 2022-01-15 DIAGNOSIS — R0609 Other forms of dyspnea: Secondary | ICD-10-CM | POA: Diagnosis not present

## 2022-01-16 LAB — ECHOCARDIOGRAM COMPLETE
Area-P 1/2: 4.36 cm2
S' Lateral: 2.7 cm

## 2022-01-23 ENCOUNTER — Telehealth: Payer: Self-pay

## 2022-01-23 NOTE — Telephone Encounter (Signed)
LVM per DPR per Dr. Krasowski's note. Encouraged to call with any questions or concerns. 

## 2022-04-03 ENCOUNTER — Ambulatory Visit: Payer: Managed Care, Other (non HMO) | Attending: Cardiology | Admitting: Cardiology

## 2022-04-03 ENCOUNTER — Encounter: Payer: Self-pay | Admitting: Cardiology

## 2022-04-03 VITALS — BP 128/90 | HR 92 | Ht 67.0 in | Wt 219.2 lb

## 2022-04-03 DIAGNOSIS — R002 Palpitations: Secondary | ICD-10-CM

## 2022-04-03 DIAGNOSIS — E782 Mixed hyperlipidemia: Secondary | ICD-10-CM | POA: Diagnosis not present

## 2022-04-03 DIAGNOSIS — F419 Anxiety disorder, unspecified: Secondary | ICD-10-CM

## 2022-04-03 DIAGNOSIS — F32A Depression, unspecified: Secondary | ICD-10-CM

## 2022-04-03 NOTE — Patient Instructions (Signed)
Medication Instructions:  Your physician recommends that you continue on your current medications as directed. Please refer to the Current Medication list given to you today.  *If you need a refill on your cardiac medications before your next appointment, please call your pharmacy*   Lab Work: None If you have labs (blood work) drawn today and your tests are completely normal, you will receive your results only by: MyChart Message (if you have MyChart) OR A paper copy in the mail If you have any lab test that is abnormal or we need to change your treatment, we will call you to review the results.   Testing/Procedures: None   Follow-Up: At [facility], you and your health needs are our priority.  As part of our continuing mission to provide you with exceptional heart care, we have created designated Provider Care Teams.  These Care Teams include your primary Cardiologist (physician) and Advanced Practice Providers (APPs -  Physician Assistants and Nurse Practitioners) who all work together to provide you with the care you need, when you need it.  We recommend signing up for the patient portal called "MyChart".  Sign up information is provided on this After Visit Summary.  MyChart is used to connect with patients for Virtual Visits (Telemedicine).  Patients are able to view lab/test results, encounter notes, upcoming appointments, etc.  Non-urgent messages can be sent to your provider as well.   To learn more about what you can do with MyChart, go to [email or web address].    Your next appointment:   1 year(s)  Provider:   Robert Krasowski, MD    Other Instructions   

## 2022-04-03 NOTE — Progress Notes (Signed)
Cardiology Office Note:    Date:  04/03/2022   ID:  Neil White, DOB 1991/03/04, MRN [id number]  PCP:  de [place], Raymond J, MD  Cardiologist:  Jenne Campus, MD    Referring MD: de [place], Blondell Reveal, MD   Chief Complaint  Patient presents with   monitor results    History of Present Illness:    Neil White is a 32 y.o. male with past medical history significant for asthma, palpitations, elevated blood pressure without diagnosis of hypertension, he was referred to [place] because of palpitations as well as some shortness of breath.  Evaluation included echocardiogram showed preserved left ventricle ejection fraction as well as Zio patch which showed some APCs but no more significant arrhythmia.  He comes today to months for follow-up he is doing quite well.  He said palpitations improved.  He denies have any chest pain tightness squeezing pressure burning chest.  He tried to exercise on the regular basis but he said he have not been in gym for the last 3 months.  I encouraged him to go to the gym.  Past Medical History:  Diagnosis Date   Allergy to alpha-gal    Asthma    Bronchitis    Sleep apnea     Past Surgical History:  Procedure Laterality Date   ANKLE FRACTURE SURGERY     LEG SURGERY     VASECTOMY      Current Medications: Current Meds  Medication Sig   albuterol (PROVENTIL HFA;VENTOLIN HFA) 108 (90 Base) MCG/ACT inhaler Inhale 2 puffs into the lungs every 6 (six) hours as needed for wheezing or shortness of breath.   calcium carbonate (TUMS - DOSED IN MG ELEMENTAL CALCIUM) 500 MG chewable tablet Chew 1 tablet by mouth daily as needed for indigestion or heartburn.   EPINEPHrine (AUVI-Q) 0.3 mg/0.3 mL IJ SOAJ injection Inject 0.3 mg into the muscle as needed for anaphylaxis.   famotidine (PEPCID) 10 MG tablet Take 10 mg by mouth 2 (two) times daily as needed for heartburn or indigestion.   loratadine (CLARITIN) 10 MG tablet Take 10 mg by mouth daily.   rosuvastatin  (CRESTOR) 20 MG tablet Take 20 mg by mouth daily.     Allergies:   Alpha-gal, Fish-derived products, Other, Banana, and Shellfish-derived products   Social History   Socioeconomic History   Marital status: Married    Spouse name: Not on file   Number of children: Not on file   Years of education: Not on file   Highest education level: Not on file  Occupational History   Not on file  Tobacco Use   Smoking status: Never   Smokeless tobacco: Never  Vaping Use   Vaping Use: Never used  Substance and Sexual Activity   Alcohol use: Yes    Comment: 3 mixed drinks a week   Drug use: No   Sexual activity: Yes  Other Topics Concern   Not on file  Social History Narrative   Not on file   Social Determinants of Health   Financial Resource Strain: Not on file  Food Insecurity: Not on file  Transportation Needs: Not on file  Physical Activity: Not on file  Stress: Not on file  Social Connections: Not on file     Family History: The patient's family history includes Cancer in his maternal grandfather; Diabetes in his maternal grandmother and paternal grandfather; Heart disease in his father and paternal grandfather; Hyperlipidemia in his father and paternal grandfather. There is no history  of Allergic rhinitis, Angioedema, Asthma, Atopy, Eczema, Immunodeficiency, or Urticaria. ROS:   Please see the history of present illness.    All 14 point review of systems negative except as described per history of present illness  EKGs/Labs/Other Studies Reviewed:      Recent Labs: No results found for requested labs within last 365 days.  Recent Lipid Panel No results found for: "CHOL", "TRIG", "HDL", "CHOLHDL", "VLDL", "LDLCALC", "LDLDIRECT"  Physical Exam:    VS:  BP (!) 128/90 (BP Location: Left Arm, Patient Position: Sitting)   Pulse 92   Ht 5\' 7"  (1.702 m)   Wt 219 lb 3.2 oz (99.4 kg)   SpO2 97%   BMI 34.33 kg/m     Wt Readings from Last 3 Encounters:  04/03/22 219 lb 3.2  oz (99.4 kg)  12/31/21 221 lb 3.2 oz (100.3 kg)  02/28/21 220 lb (99.8 kg)     GEN:  Well nourished, well developed in no acute distress HEENT: Normal NECK: No JVD; No carotid bruits LYMPHATICS: No lymphadenopathy CARDIAC: RRR, no murmurs, no rubs, no gallops RESPIRATORY:  Clear to auscultation without rales, wheezing or rhonchi  ABDOMEN: Soft, non-tender, non-distended MUSCULOSKELETAL:  No edema; No deformity  SKIN: Warm and dry LOWER EXTREMITIES: no swelling NEUROLOGIC:  Alert and oriented x 3 PSYCHIATRIC:  Normal affect   ASSESSMENT:    1. Palpitations   2. Mixed hyperlipidemia   3. Anxiety and depression    PLAN:    In order of problems listed above:  Palpitations.  Monitor did not show any significant arrhythmia.  Echocardiogram showed preserved left ventricle ejection fraction.  APCs are noted but not significant. Mixed dyslipidemia, he is taking his Crestor 20 mg daily and his LDL is 81 HDL 35 good cholesterol control continue present management. Anxiety and the depression probably contributing to his symptomatology likely cardiac workup has been negative his risk factors have been met well modified.   Medication Adjustments/Labs and Tests Ordered: Current medicines are reviewed at length with the patient today.  Concerns regarding medicines are outlined above.  No orders of the defined types were placed in this encounter.  Medication changes: No orders of the defined types were placed in this encounter.   Signed, Park Liter, MD, [facility] 04/03/2022 4:51 PM    [place]

## 2023-04-03 ENCOUNTER — Ambulatory Visit: Payer: Medicaid Other | Admitting: Allergy and Immunology

## 2023-04-03 ENCOUNTER — Encounter: Payer: Self-pay | Admitting: Allergy and Immunology

## 2023-04-03 VITALS — BP 122/84 | HR 80 | Resp 20 | Ht 67.0 in | Wt 211.0 lb

## 2023-04-03 DIAGNOSIS — Z91018 Allergy to other foods: Secondary | ICD-10-CM

## 2023-04-03 DIAGNOSIS — R053 Chronic cough: Secondary | ICD-10-CM

## 2023-04-03 NOTE — Progress Notes (Signed)
- [place] - [place] - [place] - [place]   Dear Dr. Kerry Hough,  Thank you for referring Neil White to the [facility] [place] on 04/03/2023.   Below is a summation of this patient's evaluation and recommendations.  Thank you for your referral. I will keep you informed about this patient's response to treatment.   If you have any questions please do not hesitate to contact me.   Sincerely,  Jessica Priest, MD Allergy / Immunology [place] [facility]   ______________________________________________________________________    NEW PATIENT NOTE  Referring Provider: Erick Blinks, MD Primary Provider: Carilyn Goodpasture, NP Date of office visit: 04/03/2023    Subjective:   Chief Complaint:  Neil White (DOB: 05-02-1990) is a 33 y.o. male who presents to the clinic on 04/03/2023 with a chief complaint of food allergy. Marland Kitchen     HPI: Neil White presents to this clinic in evaluation of food allergy.  Apparently he has been diagnosed with alpha gal syndrome and hypersensitivity directed against fish and shellfish based upon a history of diffuse urticaria when consuming mammal meat and a respiratory event when eating tilapia.  He has been mammal free and seafood free since 2012.  He also states that he has been having a cough for a few years that appears to be an intermittent issue and is not associated with any other respiratory tract symptoms or throat symptoms or upper airway symptoms and has been treated with various inhalers and therapy against reflux and it does not really appear as though anything really helps this cough.  Past Medical History:  Diagnosis Date   Allergy to alpha-gal    Asthma    Bronchitis    Sleep apnea     Past Surgical History:  Procedure Laterality Date   ANKLE FRACTURE SURGERY     LEG SURGERY     VASECTOMY  11/2021    Allergies as of 04/03/2023        Reactions   Alpha-gal Anaphylaxis   Fish-derived Products Anaphylaxis, Cough, Rash, Shortness Of Breath, Swelling   Other Anaphylaxis   All meats except chicken and [place]    Banana Other (See Comments)   Chest pain    Shellfish-derived Products    Other reaction(s): Unknown        Medication List    albuterol 108 (90 Base) MCG/ACT inhaler Commonly known as: VENTOLIN HFA Inhale 2 puffs into the lungs every 6 (six) hours as needed for wheezing or shortness of breath.   Auvi-Q 0.3 mg/0.3 mL Soaj injection Generic drug: EPINEPHrine Inject 0.3 mg into the muscle as needed for anaphylaxis.   cetirizine 10 MG tablet Commonly known as: ZYRTEC Take 10 mg by mouth daily.   omeprazole 20 MG capsule Commonly known as: PRILOSEC Take 20 mg by mouth daily.   phentermine 37.5 MG tablet Commonly known as: ADIPEX-P Take 37.5 mg by mouth every morning.   rosuvastatin 20 MG tablet Commonly known as: CRESTOR Take 20 mg by mouth daily.    Review of systems negative except as noted in HPI / PMHx or noted below:  Review of Systems  Constitutional: Negative.   HENT: Negative.    Eyes: Negative.   Respiratory: Negative.    Cardiovascular: Negative.   Gastrointestinal: Negative.   Genitourinary: Negative.   Musculoskeletal: Negative.   Skin: Negative.   Neurological: Negative.   Endo/Heme/Allergies: Negative.   Psychiatric/Behavioral: Negative.  Family History  Problem Relation Age of Onset   Hyperlipidemia Father    Heart disease Father    Diabetes Maternal Grandmother    Cancer Maternal Grandfather    Diabetes Paternal Grandfather    Heart disease Paternal Grandfather    Hyperlipidemia Paternal Grandfather    Allergic rhinitis Neg Hx    Angioedema Neg Hx    Asthma Neg Hx    Atopy Neg Hx    Eczema Neg Hx    Immunodeficiency Neg Hx    Urticaria Neg Hx     Social History   Socioeconomic History   Marital status: Married    Spouse name: Not on file   Number of  children: Not on file   Years of education: Not on file   Highest education level: Not on file  Occupational History   Not on file  Tobacco Use   Smoking status: Never   Smokeless tobacco: Never  Vaping Use   Vaping status: Never Used  Substance and Sexual Activity   Alcohol use: Not Currently   Drug use: No   Sexual activity: Yes  Other Topics Concern   Not on file  Social History Narrative   Not on file    Environmental and Social history  Lives in a house with a dry environment, a dog and cat look inside the household, carpet in the bedroom, plastic on the bed, no plastic on the pillow, no smoking ongoing inside the household.  He works as a Personnel officer.  Objective:   Vitals:   04/03/23 1430  BP: 122/84  Pulse: 80  Resp: 20  SpO2: 95%   Height: 5\' 7"  (170.2 cm) Weight: 211 lb (95.7 kg)  Physical Exam Constitutional:      Appearance: He is not diaphoretic.  HENT:     Head: Normocephalic.     Right Ear: Tympanic membrane, ear canal and external ear normal.     Left Ear: Tympanic membrane, ear canal and external ear normal.     Nose: Nose normal. No mucosal edema or rhinorrhea.     Mouth/Throat:     Pharynx: Uvula midline. No oropharyngeal exudate.  Eyes:     Conjunctiva/sclera: Conjunctivae normal.  Neck:     Thyroid: No thyromegaly.     Trachea: Trachea normal. No tracheal tenderness or tracheal deviation.  Cardiovascular:     Rate and Rhythm: Normal rate and regular rhythm.     Heart sounds: Normal heart sounds, S1 normal and S2 normal. No murmur heard. Pulmonary:     Effort: No respiratory distress.     Breath sounds: Normal breath sounds. No stridor. No wheezing or rales.  Lymphadenopathy:     Head:     Right side of head: No tonsillar adenopathy.     Left side of head: No tonsillar adenopathy.     Cervical: No cervical adenopathy.  Skin:    Findings: No erythema or rash.     Nails: There is no clubbing.  Neurological:     Mental Status: He is  alert.     Diagnostics:    Assessment and Plan:    1. Food allergy   2. Chronic cough    1.  Blood - alpha-gal panel, shellfish panel, tilapia IgE, fish panel  2.  Epi-pen / Auvi-Q???  3.  Food challenge???  4.  Review previous evaluation for cough.  Further evaluation???  We are going to focus on Christophers history of food allergy at this point and have him check  IgE antibodies directed can specific food products and will consider a possible in-clinic food challenge with specific foods depending on the results of these test.  He has a cough has been present for quite a long period in time and I will review all of his previous evaluation and treatment and we will make a decision about how to move forward regarding this issue once I can review all of these results.  Jessica Priest, MD Allergy / Immunology [facility] [place]

## 2023-04-03 NOTE — Patient Instructions (Addendum)
  1.  Blood - alpha-gal panel, shellfish panel, tilapia IgE, fish panel  2.  Epi-pen / Auvi-Q???  3.  Food challenge???  4.  Review previous evaluation for cough.  Further evaluation???

## 2023-04-05 LAB — ALLERGEN PROFILE, FOOD-FISH
Allergen Mackerel IgE: <0.1 kU/L
Allergen Salmon IgE: <0.1 kU/L
Allergen Trout IgE: 0.11 kU/L — AB
Allergen Walley Pike IgE: <0.1 kU/L
Codfish IgE: <0.1 kU/L
Halibut IgE: <0.1 kU/L
Tuna: <0.1 kU/L

## 2023-04-05 LAB — ALLERGEN PROFILE, SHELLFISH
Clam IgE: 0.4 kU/L — AB
F023-IgE Crab: 0.29 kU/L — AB
F080-IgE Lobster: 0.18 kU/L — AB
F290-IgE Oyster: 0.2 kU/L — AB
Scallop IgE: 0.37 kU/L — AB
Shrimp IgE: 0.23 kU/L — AB

## 2023-04-05 LAB — ALPHA-GAL PANEL
Allergen Lamb IgE: 2.37 kU/L — AB
Beef IgE: 1.08 kU/L — AB
IgE (Immunoglobulin E), Serum: 404 [IU]/mL (ref 6–495)
O215-IgE Alpha-Gal: 0.8 kU/L — AB
Pork IgE: 2.94 kU/L — AB

## 2023-04-05 LAB — ALLERGEN TILAPIA F414: Allergen Tilapia IgE: <0.1 kU/L

## 2023-04-07 ENCOUNTER — Encounter: Payer: Self-pay | Admitting: Allergy and Immunology

## 2023-04-07 DIAGNOSIS — R053 Chronic cough: Secondary | ICD-10-CM

## 2023-04-10 ENCOUNTER — Ambulatory Visit (HOSPITAL_BASED_OUTPATIENT_CLINIC_OR_DEPARTMENT_OTHER)
Admission: RE | Admit: 2023-04-10 | Discharge: 2023-04-10 | Disposition: A | Discharge status: Home / Self Care | Payer: Medicaid Other | Source: Ambulatory Visit | Attending: Allergy and Immunology | Admitting: Allergy and Immunology

## 2023-04-10 DIAGNOSIS — R053 Chronic cough: Secondary | ICD-10-CM | POA: Diagnosis not present

## 2023-04-21 ENCOUNTER — Encounter: Payer: Self-pay | Admitting: *Deleted

## 2023-05-21 DIAGNOSIS — M79661 Pain in right lower leg: Secondary | ICD-10-CM | POA: Insufficient documentation

## 2023-05-21 HISTORY — DX: Pain in right lower leg: M79.661

## 2023-06-24 ENCOUNTER — Other Ambulatory Visit (HOSPITAL_BASED_OUTPATIENT_CLINIC_OR_DEPARTMENT_OTHER): Payer: Self-pay | Admitting: Internal Medicine

## 2023-06-24 DIAGNOSIS — R202 Paresthesia of skin: Secondary | ICD-10-CM

## 2023-07-15 ENCOUNTER — Encounter (HOSPITAL_COMMUNITY)

## 2023-07-21 ENCOUNTER — Ambulatory Visit (HOSPITAL_BASED_OUTPATIENT_CLINIC_OR_DEPARTMENT_OTHER)
Admission: RE | Admit: 2023-07-21 | Discharge: 2023-07-21 | Disposition: A | Discharge status: Home / Self Care | Source: Ambulatory Visit | Attending: Family Medicine | Admitting: Family Medicine

## 2023-07-21 DIAGNOSIS — R202 Paresthesia of skin: Secondary | ICD-10-CM | POA: Diagnosis present

## 2023-07-23 LAB — VAS US ABI WITH/WO TBI
Left ABI: 0.23
Right ABI: 1.27

## 2024-04-08 ENCOUNTER — Encounter: Payer: Self-pay | Admitting: *Deleted

## 2024-04-12 ENCOUNTER — Ambulatory Visit: Admitting: Cardiology

## 2024-04-12 VITALS — BP 124/82 | HR 71 | Ht 68.0 in | Wt 221.1 lb

## 2024-04-12 DIAGNOSIS — J45909 Unspecified asthma, uncomplicated: Secondary | ICD-10-CM | POA: Insufficient documentation

## 2024-04-12 DIAGNOSIS — R0789 Other chest pain: Secondary | ICD-10-CM | POA: Diagnosis not present

## 2024-04-12 DIAGNOSIS — E785 Hyperlipidemia, unspecified: Secondary | ICD-10-CM | POA: Diagnosis not present

## 2024-04-12 DIAGNOSIS — R030 Elevated blood-pressure reading, without diagnosis of hypertension: Secondary | ICD-10-CM | POA: Diagnosis not present

## 2024-04-12 DIAGNOSIS — G4730 Sleep apnea, unspecified: Secondary | ICD-10-CM | POA: Insufficient documentation

## 2024-04-12 DIAGNOSIS — R002 Palpitations: Secondary | ICD-10-CM

## 2024-04-12 DIAGNOSIS — Z91018 Allergy to other foods: Secondary | ICD-10-CM | POA: Insufficient documentation

## 2024-04-12 DIAGNOSIS — J40 Bronchitis, not specified as acute or chronic: Secondary | ICD-10-CM | POA: Insufficient documentation

## 2024-04-12 NOTE — Progress Notes (Signed)
 " Cardiology Office Note:    Date:  04/12/2024   ID:  Neil White, DOB 1990/07/17, MRN [id number]  PCP:  Cristopher Bottcher, NP  Cardiologist:  Lamar Fitch, MD    Referring MD: No ref. provider found   No chief complaint on file. Doing great  History of Present Illness:     Neil White is a 34 y.o. 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  Comes back to my office for regular follow-up he is doing great, asymptomatic stopped drinking about 1-1/2-year ago denies have any palpitation no chest pain tightness squeezing pressure burning chest, work heart have no difficulty doing.   Past Medical History:  Diagnosis Date   Allergy  to alpha-gal    Anxiety and depression 02/28/2021   Asthma    Atypical chest pain 12/31/2021   Bilateral calf pain 05/21/2023   Bronchitis    Chronic allergic rhinitis 03/30/2016   Allergy  profile 03/29/2016 >  Eos 0.5/  IgE  529 RAST Pos Cat > dog/ dust/grass/trees  - Singulair  trial 03/29/2016 >>>         Chronic cough 11/08/2013   Formatting of this note might be different from the original.  allergies     Elevated blood pressure reading without diagnosis of hypertension 02/28/2021   Hyperlipidemia 02/28/2021   Mild persistent asthma without complication 03/29/2016   FENO 03/29/2016  =   34  - 03/29/2016  After extensive coaching HFA effectiveness =    75% from a baseline of 25%  - trial of singulair  03/29/2016 >>>          Palpitations 12/31/2021   Panic attacks 11/08/2013   Formatting of this note might be different from the original.  Treated with prozac in past - situational and stress induced.     Seasonal allergies 11/08/2013   Formatting of this note might be different from the original.  Associated with chronic cough     Sleep apnea     Past Surgical History:  Procedure Laterality Date   ANKLE FRACTURE SURGERY     LEG SURGERY     VASECTOMY  11/2021    Current  Medications: Active Medications[1]   Allergies:   Alpha-gal, Fish protein-containing drug products, Other, Banana, and Shellfish protein-containing drug products   Social History   Socioeconomic History   Marital status: Married    Spouse name: Not on file   Number of children: Not on file   Years of education: Not on file   Highest education level: Associate degree: occupational, scientist, product/process development, or vocational program  Occupational History   Not on file  Tobacco Use   Smoking status: Never   Smokeless tobacco: Never  Vaping Use   Vaping status: Never Used  Substance and Sexual Activity   Alcohol use: Not Currently   Drug use: No   Sexual activity: Yes  Other Topics Concern   Not on file  Social History Narrative   Not on file   Social Drivers of Health   Tobacco Use: Low Risk (04/12/2024)   Patient History    Smoking Tobacco Use: Never    Smokeless Tobacco Use: Never    Passive Exposure: Not on file  Financial Resource Strain: Medium Risk (04/12/2024)   Overall Financial Resource Strain (CARDIA)    Difficulty of Paying Living Expenses: Somewhat hard  Food Insecurity: Food Insecurity Present (04/12/2024)   Epic    Worried About Radiation Protection Practitioner of Food in the Last  Year: Sometimes true    Ran Out of Food in the Last Year: Sometimes true  Transportation Needs: Unmet Transportation Needs (04/12/2024)   Epic    Lack of Transportation (Medical): No    Lack of Transportation (Non-Medical): Yes  Physical Activity: Insufficiently Active (04/12/2024)   Exercise Vital Sign    Days of Exercise per Week: 2 days    Minutes of Exercise per Session: 30 min  Stress: Stress Concern Present (04/12/2024)   [employer] of Occupational Health - Occupational Stress Questionnaire    Feeling of Stress: To some extent  Social Connections: Socially Isolated (04/12/2024)   Social Connection and Isolation Panel    Frequency of Communication with Friends and Family: Never    Frequency of Social Gatherings  with Friends and Family: Once a week    Attends Religious Services: Never    Database Administrator or Organizations: No    Attends Engineer, Structural: Not on file    Marital Status: Married  Depression (PHQ2-9): Not on file  Alcohol Screen: Not on file  Housing: Low Risk (04/12/2024)   Epic    Unable to Pay for Housing in the Last Year: No    Number of Times Moved in the Last Year: 1    Homeless in the Last Year: No  Utilities: Not on file  Health Literacy: Not on file     Family History: The patient's family history includes Cancer in his maternal grandfather; Diabetes in his maternal grandmother and paternal grandfather; Heart disease in his father and paternal grandfather; Hyperlipidemia in his father and paternal grandfather. There is no history of Allergic rhinitis, Angioedema, Asthma, Atopy, Eczema, Immunodeficiency, or Urticaria. ROS:   Please see the history of present illness.    All 14 point review of systems negative except as described per history of present illness  EKGs/Labs/Other Studies Reviewed:         Recent Labs: No results found for requested labs within last 365 days.  Recent Lipid Panel No results found for: CHOL, TRIG, HDL, CHOLHDL, VLDL, LDLCALC, LDLDIRECT  Physical Exam:    VS:  BP 124/82   Pulse 71   Ht 5' 8 (1.727 m)   Wt 221 lb 2 oz (100.3 kg)   SpO2 98%   BMI 33.62 kg/m     Wt Readings from Last 3 Encounters:  04/12/24 221 lb 2 oz (100.3 kg)  04/03/23 211 lb (95.7 kg)  04/03/22 219 lb 3.2 oz (99.4 kg)     GEN:  Well nourished, well developed in no acute distress HEENT: Normal NECK: No JVD; No carotid bruits LYMPHATICS: No lymphadenopathy CARDIAC: RRR, no murmurs, no rubs, no gallops RESPIRATORY:  Clear to auscultation without rales, wheezing or rhonchi  ABDOMEN: Soft, non-tender, non-distended MUSCULOSKELETAL:  No edema; No deformity  SKIN: Warm and dry LOWER EXTREMITIES: no swelling NEUROLOGIC:  Alert  and oriented x 3 PSYCHIATRIC:  Normal affect   ASSESSMENT:    1. Hyperlipidemia, unspecified hyperlipidemia type   2. Atypical chest pain   3. Palpitations   4. Elevated blood pressure reading without diagnosis of hypertension    PLAN:    In order of problems listed above:  Palpitations gone doing well Dyslipidemia he is on Crestor 20 which I will continue I did review KPN which show me LDL 66 HDL 41 continue present management. Elevated blood pressure blood pressure is well-controlled. History of EtOH abuse.  Now he quit drinking completely doing very well. Will see him  on as-needed basis   Medication Adjustments/Labs and Tests Ordered: Current medicines are reviewed at length with the patient today.  Concerns regarding medicines are outlined above.  Orders Placed This Encounter  Procedures   EKG 12-Lead   Medication changes: No orders of the defined types were placed in this encounter.   Signed, Lamar DOROTHA Fitch, MD, [facility] 04/12/2024 4:03 PM    [facility]    [1]  Current Meds  Medication Sig   albuterol  (PROVENTIL  HFA;VENTOLIN  HFA) 108 (90 Base) MCG/ACT inhaler Inhale 2 puffs into the lungs every 6 (six) hours as needed for wheezing or shortness of breath.   EPINEPHrine  (AUVI-Q ) 0.3 mg/0.3 mL IJ SOAJ injection Inject 0.3 mg into the muscle as needed for anaphylaxis.   omeprazole (PRILOSEC) 20 MG capsule Take 20 mg by mouth daily.   OXcarbazepine (TRILEPTAL) 150 MG tablet Take 150 mg by mouth 2 (two) times daily.   rosuvastatin (CRESTOR) 20 MG tablet Take 20 mg by mouth daily.   "
